# Patient Record
Sex: Female | Born: 2000 | Race: Black or African American | Hispanic: No | Marital: Single | Smoking: Never smoker
Health system: Southern US, Community
[De-identification: ages and names within clinical notes are randomized; demographics above are authoritative.]

## PROBLEM LIST (undated history)

## (undated) DIAGNOSIS — D649 Anemia, unspecified: Secondary | ICD-10-CM

## (undated) DIAGNOSIS — R51 Headache: Secondary | ICD-10-CM

## (undated) DIAGNOSIS — R569 Unspecified convulsions: Secondary | ICD-10-CM

## (undated) DIAGNOSIS — N920 Excessive and frequent menstruation with regular cycle: Secondary | ICD-10-CM

## (undated) HISTORY — DX: Excessive and frequent menstruation with regular cycle: N92.0

## (undated) HISTORY — DX: Headache: R51

## (undated) HISTORY — DX: Anemia, unspecified: D64.9

---
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
## 2022-01-06 ENCOUNTER — Telehealth (INDEPENDENT_AMBULATORY_CARE_PROVIDER_SITE_OTHER): Payer: Self-pay

## 2022-01-06 NOTE — Telephone Encounter (Signed)
Submitted PA on Covermy meds patient only has [facility] Medicaid and pharm is using BCBS. Mom verified this info. She will contact pharm and have them remove the BCBS- RN adv she is submitting the PA- Brand necessary due to having seizures when on generic

## 2022-01-06 NOTE — Telephone Encounter (Signed)
Faxed the PA approval note from Cover My Meds to [facility].

## 2022-03-16 ENCOUNTER — Encounter (INDEPENDENT_AMBULATORY_CARE_PROVIDER_SITE_OTHER): Payer: Self-pay | Admitting: Family

## 2022-03-16 ENCOUNTER — Ambulatory Visit (INDEPENDENT_AMBULATORY_CARE_PROVIDER_SITE_OTHER): Payer: Medicaid Other | Admitting: Family

## 2022-03-16 VITALS — BP 106/60 | HR 74 | Ht 63.94 in | Wt 113.0 lb

## 2022-03-16 DIAGNOSIS — G40309 Generalized idiopathic epilepsy and epileptic syndromes, not intractable, without status epilepticus: Secondary | ICD-10-CM | POA: Diagnosis not present

## 2022-03-16 DIAGNOSIS — G40209 Localization-related (focal) (partial) symptomatic epilepsy and epileptic syndromes with complex partial seizures, not intractable, without status epilepticus: Secondary | ICD-10-CM

## 2022-03-16 DIAGNOSIS — G43009 Migraine without aura, not intractable, without status migrainosus: Secondary | ICD-10-CM | POA: Diagnosis not present

## 2022-03-16 DIAGNOSIS — G479 Sleep disorder, unspecified: Secondary | ICD-10-CM | POA: Diagnosis not present

## 2022-03-16 DIAGNOSIS — R0981 Nasal congestion: Secondary | ICD-10-CM | POA: Diagnosis not present

## 2022-03-16 NOTE — Patient Instructions (Signed)
It was a pleasure to see you today!  Instructions for you until your next appointment are as follows: I have ordered a sleep test called a polysomnogram. You will receive a call to schedule that from the [facility].  I will call you when I receive the sleep test results Continue your medications as prescribed.  Let me know if you have seizures or any other concerns.  Please sign up for MyChart if you have not done so. Please plan to return for follow up in 6 months or sooner if needed.  Feel free to contact our office during normal business hours at [phone] with questions or concerns. If there is no answer or the call is outside business hours, please leave a message and our clinic staff will call you back within the next business day.  If you have an urgent concern, please stay on the line for our after-hours answering service and ask for the on-call neurologist.     I also encourage you to use MyChart to communicate with me more directly. If you have not yet signed up for MyChart within [facility], the front desk staff can help you. However, please note that this inbox is NOT monitored on nights or weekends, and response can take up to 2 business days.  Urgent matters should be discussed with the on-call pediatric neurologist.   At [facility], we are committed to providing exceptional care. You will receive a patient satisfaction survey through text or email regarding your visit today. Your opinion is important to me. Comments are appreciated.

## 2022-03-16 NOTE — Progress Notes (Signed)
Madison [name]   MRN:  [id number]  04-08-2001   Provider: Rockwell Germany NP-C Location of Care: [facility]  Visit type: Return visit   Last visit: 09/08/2021  Referral source: Monna Fam, MD  History from: Epic chart, patient and her mother  Brief history:  Copied from previous record: History of complex partial seizures with secondary generalization, migraine without aura and habitual throat clearing. She is taking and tolerating Tegretol for seizures. She was also taking Topiramate, which improved migraine frequency but stopped it in October 2022. She was evaluated for habitual throat clearing by ENT and was told that it was mild allergy drainage  Today's concerns: Madison [name] and her mother report today that she has had 3 seizures since her last visit. They report that they were brief and did not require intervention. She reports that she is compliant with medication but doesn't sleep well and often awakens feeling as if she is choking and gasping for air. She has ongoing problems with stuffy nose and has been evaluated by ENT for this. Mom has not noted snoring or seizure activity during sleep.   Madison [name] is doing well in school and has been otherwise generally healthy since she was last seen. Neither she nor her mother have other health concerns for her today other than previously mentioned.  Review of systems: Please see HPI for neurologic and other pertinent review of systems. Otherwise all other systems were reviewed and were negative.  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  One seizure occurred during the MRI evaluation.  MRI July 28, 2009 showed increased diffusion and flair signal in the right temporal lobe, right parietal and posterior frontal regions that was cortical and subcortical.  There were also diffusion and ADC changes in the right thalamus.  This was thought to be related to seizures and not a structural or vascular deficit.   One of the episodes was consistent with status epilepticus.  The semiology of seizures was localization-related with secondary generalization with a obvious right brain signature.   EEG July 29, 2009 showed right focal slowing more so than the left.  No seizure activity was seen.  This was consistent with a postictal state.  She was placed on carbamazepine.   In addition to her seizures she had frequent migraine headaches that were severe associated with incapacitating pain and on occasion nausea and vomiting.  In March 2011 propranolol was started to treat her migraines.   ED visit October 04, 2009 with deviation of her eyes the left and rhythmic jerking of her extremities.   ED visit December 28, 2009 with a  seizure that was prolonged treated with rectal diazepam and nasal versed that caused respiratory depression and temporary intubation.  Mother had cut the carbamazepine level because the child was sleepy.   MRI brain August to 2011 showed marked diminution in hyperintensity in the cortical structures and slight increased T2  signal and fullness in the right hippocampal formation.   Telephone note September 22, 2010: 3 seizures in the morning (carbamazepine had been discontinued since December).  This was restarted.   EEG April 08, 2011 showed frequent runs of occipital triphasic spike and slow-wave discharge that was 4 Hz and 165 - 285 V lasting 1-2 seconds without clinical accompaniments more prominent on the right than the left, independent and synchronous.  Levetiracetam was started because carbamazepine made her sleepy but she was unable to tolerate it because of problems with mood, behavior, and bad dreams.   The patient had frequent seizures at school: topiramate was started Nov 12 2011 with significant diminution of seizures.  She had myoclonic jerks at nighttime.  Migraines also improved.   The patient was seen by Dr. Darlis Loan with a history of chest pain, palpitations, and episodes of lightheadedness.  She had nonspecific EKG, a functional heart murmur, and possible right ventricular hypertrophy.   Birth History Infant born at [redacted] weeks gestational age   Gestation was uncomplicated Mother received Epidural anesthesia repeat cesarean section Nursery Course was uncomplicated Growth and Development was recalled as  normal   Surgical history: History reviewed. No pertinent surgical history.   Family history: family history includes Diabetes in her maternal grandmother; Heart Problems in her paternal grandmother; Hypertension in her maternal grandmother; Lung cancer in her maternal grandfather; Migraines in an other family member; Seizures in her brother and another family member.   Social history: Social History   Socioeconomic History   Marital status: Single    Spouse name: Not on file   Number of children: Not on file   Years of education: Not on file   Highest education level: Not on file  Occupational History   Not on file  Tobacco Use   Smoking status: Never    Passive exposure: Never    Smokeless tobacco: Never  Vaping Use   Vaping Use: Never used  Substance and Sexual Activity   Alcohol use: No    Alcohol/week: 0.0 standard drinks of alcohol   Drug use: No   Sexual activity: Never  Other Topics Concern   Not on file  Social History Narrative   Rojean is in 10th grade student.   She will attend [employer]. School will be remote   She lives with her mother and brother.    She enjoys dance, shopping, and playing.   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   Diagnostics/Screenings: Copied from previous record: MRI July 28, 2009 showed increased diffusion and flair signal in the  right temporal lobe, right parietal and posterior frontal regions that was cortical and subcortical.  There were also diffusion and ADC changes in the right thalamus.  This was thought to be related to seizures and not a structural or vascular deficit.   EEG July 29, 2009 showed right focal slowing more so than the left.  No seizure activity was seen.  This was consistent with a postictal state.    MRI brain August to 2011 showed marked diminution in hyperintensity in the cortical structures and slight increased T2 signal and fullness in the right hippocampal formation.   EEG April 08, 2011 showed frequent runs of occipital triphasic spike and slow-wave discharge that was  4 Hz and 165 - 285 V lasting 1-2 seconds without clinical accompaniments more prominent on the right than the left, independent and synchronous.     Physical Exam: BP 106/60   Pulse 74   Ht 5' 3.94" (1.624 m)   Wt 113 lb (51.3 kg)   BMI 19.43 kg/m   General: Well developed, well nourished adolescent girl, seated on exam table, in no evident distress Head: Head normocephalic and atraumatic.  Oropharynx benign. Neck: Supple Cardiovascular: Regular rate and rhythm, no murmurs Respiratory: Breath sounds clear to auscultation Musculoskeletal: No obvious deformities or scoliosis Skin: No rashes or neurocutaneous lesions  Neurologic Exam Mental Status: Awake and fully alert.  Oriented to place and time.  Recent and remote memory intact.  Attention span, concentration, and fund of knowledge appropriate.  Mood and affect appropriate. Cranial Nerves: Fundoscopic exam reveals sharp disc margins.  Pupils equal, briskly reactive to light.  Extraocular movements full without nystagmus. Hearing intact and symmetric to whisper.  Facial sensation intact.  Face tongue, palate move normally and symmetrically. Shoulder shrug normal Motor: Normal bulk and tone. Normal strength in all tested extremity muscles. Sensory: Intact to touch and temperature in all extremities.  Coordination: Rapid alternating movements normal in all extremities.  Finger-to-nose and heel-to shin performed accurately bilaterally.  Romberg negative. Gait and Station: Arises from chair without difficulty.  Stance is normal. Gait demonstrates normal stride length and balance.   Able to heel, toe and tandem walk without difficulty. Reflexes: 1+ and symmetric. Toes downgoing.   Impression: Generalized convulsive epilepsy (HCC) - Plan: Nocturnal polysomnography, Ambulatory referral to Neurology  Sleep disturbance - Plan: Nocturnal polysomnography, Ambulatory referral to Neurology  Migraine without aura and without status migrainosus, not  intractable - Plan: Nocturnal polysomnography, Ambulatory referral to Neurology  Stuffy nose - Plan: Nocturnal polysomnography, Ambulatory referral to Neurology   Recommendations for plan of care: The patient's previous Epic records were reviewed. Kitara has neither had nor required imaging or lab studies since the last visit. She has had 3 brief seizures since her last visit that did not require intervention. She reports problems with sleep and awakening with a choking sensation and gasping for breath. I recommended sleep study and will refer her for that. I asked her to let me know if more seizures occur. I will see her back in follow up in 6 months or sooner if needed.   The medication list was reviewed and reconciled. No changes were made in the prescribed medications today. A complete medication list was provided to the patient.  Orders Placed This Encounter  Procedures   Ambulatory referral to Neurology    Referral Priority:   Routine    Referral Type:   Consultation    Referral Reason:   Specialty Services Required    Requested  Specialty:   Neurology    Number of Visits Requested:   1   Nocturnal polysomnography    Standing Status:   Future    Standing Expiration Date:   03/17/2023    Scheduling Instructions:     21 year old girl with reports of waking at night feeling that she is choking or gasping to breathe. Has history of seizures but has not had witnessed seizure events or post-ictal behaviors with these events.  Perform polysomnogram to evaluate for OSA or other sleep disorder.    Order Specific Question:   Where should this test be performed:    Answer:   [facility]    Return in about 6 months (around 09/14/2022).   Allergies as of 03/16/2022       Reactions   Milk-related Compounds    History of intolerance to milk        Medication List        Accurate as of March 16, 2022 11:59 PM. If you have any questions, ask your nurse or doctor.           cetirizine 10 MG tablet Commonly known as: ZYRTEC Take 1 tablet (10 mg total) by mouth daily.   fluticasone 50 MCG/ACT nasal spray Commonly known as: FLONASE Place 1 spray into both nostrils daily. 1 spray by Nasal route once daily as needed.   ibuprofen 200 MG tablet Commonly known as: ADVIL Take 200 mg by mouth every 6 (six) hours as needed.   loratadine 10 MG tablet Commonly known as: CLARITIN Take 1 tablet (10 mg total) by mouth daily.   TEGretol 200 MG tablet Generic drug: carbamazepine Take 1+1/2 in the morning, 1 at midday and 1+1/2 at bedtime   topiramate 25 MG tablet Commonly known as: TOPAMAX Take 1 tablet at bedtime   Valtoco 10 MG Dose 10 MG/0.1ML Liqd Generic drug: diazePAM Place 10 mg into the nose as needed (For seizures 2 minutes or longer).      Total time spent with the patient was 25 minutes, of which 50% or more was spent in counseling and coordination of care.  Elveria Rising NP-C [facility] Ph. [phone] Fax [phone]

## 2022-03-20 ENCOUNTER — Encounter (INDEPENDENT_AMBULATORY_CARE_PROVIDER_SITE_OTHER): Payer: Self-pay | Admitting: Family

## 2022-03-20 MED ORDER — TOPIRAMATE 25 MG PO TABS: ORAL_TABLET | ORAL | 5 refills | Status: DC

## 2022-03-20 MED ORDER — TEGRETOL 200 MG PO TABS: ORAL_TABLET | ORAL | 5 refills | Status: DC

## 2022-04-14 ENCOUNTER — Encounter: Payer: Self-pay | Admitting: Neurology

## 2022-04-14 ENCOUNTER — Institutional Professional Consult (permissible substitution): Payer: Medicaid Other | Admitting: Neurology

## 2022-09-16 ENCOUNTER — Telehealth (INDEPENDENT_AMBULATORY_CARE_PROVIDER_SITE_OTHER): Payer: Self-pay | Admitting: Family

## 2022-09-16 ENCOUNTER — Ambulatory Visit (INDEPENDENT_AMBULATORY_CARE_PROVIDER_SITE_OTHER): Payer: Medicaid Other | Admitting: Family

## 2022-09-16 DIAGNOSIS — G40209 Localization-related (focal) (partial) symptomatic epilepsy and epileptic syndromes with complex partial seizures, not intractable, without status epilepticus: Secondary | ICD-10-CM

## 2022-09-16 DIAGNOSIS — G40309 Generalized idiopathic epilepsy and epileptic syndromes, not intractable, without status epilepticus: Secondary | ICD-10-CM

## 2022-09-16 MED ORDER — TEGRETOL 200 MG PO TABS: ORAL_TABLET | ORAL | 0 refills | Status: DC

## 2022-09-16 NOTE — Telephone Encounter (Signed)
  Name of who is calling: Shameka  Caller's Relationship to Patient: Mom  Best contact number: [phone]  Provider they see: Rockwell Germany   Reason for call: Mom is calling to get refill on prescription.       PRESCRIPTION REFILL ONLY  Name of prescription: Tegretol  Pharmacy: [facility] [place].

## 2022-09-16 NOTE — Telephone Encounter (Signed)
Rx printed and faxed to the pharmacy. TG

## 2022-10-15 ENCOUNTER — Ambulatory Visit (INDEPENDENT_AMBULATORY_CARE_PROVIDER_SITE_OTHER): Payer: Medicaid Other | Admitting: Family

## 2022-10-16 ENCOUNTER — Encounter (INDEPENDENT_AMBULATORY_CARE_PROVIDER_SITE_OTHER): Payer: Self-pay

## 2022-10-19 ENCOUNTER — Telehealth (INDEPENDENT_AMBULATORY_CARE_PROVIDER_SITE_OTHER): Payer: Self-pay | Admitting: Family

## 2022-10-19 NOTE — Telephone Encounter (Signed)
Attempted to contact patients parent to notify her of this.  Unable to be reached.  LVM to call back.  SS, CCMA

## 2022-10-19 NOTE — Telephone Encounter (Signed)
  Name of who is calling: Sleep referrals coord  Caller's Relationship to Patient:  Best contact number: [phone]  Provider they see: Annette Stable  Reason for call: Received fax referral for patient, she was scheduled to be seen back in October and was a no show. If she is still needing to be seen, have parent call back and inform them they will have to pay a no show fee and once that is paid they can be reschedule. Referral is still active     PRESCRIPTION REFILL ONLY  Name of prescription:  Pharmacy:

## 2022-10-21 ENCOUNTER — Encounter (INDEPENDENT_AMBULATORY_CARE_PROVIDER_SITE_OTHER): Payer: Self-pay | Admitting: Family

## 2022-10-21 ENCOUNTER — Ambulatory Visit (INDEPENDENT_AMBULATORY_CARE_PROVIDER_SITE_OTHER): Payer: Medicaid Other | Admitting: Family

## 2022-10-21 VITALS — BP 114/60 | HR 82 | Ht 63.74 in | Wt 110.9 lb

## 2022-10-21 DIAGNOSIS — G40209 Localization-related (focal) (partial) symptomatic epilepsy and epileptic syndromes with complex partial seizures, not intractable, without status epilepticus: Secondary | ICD-10-CM | POA: Diagnosis not present

## 2022-10-21 DIAGNOSIS — G40309 Generalized idiopathic epilepsy and epileptic syndromes, not intractable, without status epilepticus: Secondary | ICD-10-CM

## 2022-10-21 DIAGNOSIS — G43009 Migraine without aura, not intractable, without status migrainosus: Secondary | ICD-10-CM

## 2022-10-21 MED ORDER — TOPIRAMATE 25 MG PO TABS: ORAL_TABLET | ORAL | 5 refills | Status: DC

## 2022-10-21 MED ORDER — TEGRETOL 200 MG PO TABS: ORAL_TABLET | ORAL | 1 refills | Status: AC

## 2022-10-21 MED ORDER — VALTOCO 10 MG DOSE 10 MG/0.1ML NA LIQD: 10.0000 mg | NASAL | 3 refills | Status: DC | PRN

## 2022-10-21 NOTE — Progress Notes (Unsigned)
Madison [name]   MRN:  [id number]  2000/08/14   Provider: Elveria Rising NP-C Location of Care: [facility]  Visit type: Return visit  Last visit: 03/16/2022  Referral source: Aggie Hacker, MD History from: Epic chart and patient  Brief history:  Copied from previous record: History of complex partial seizures with secondary generalization, migraine without aura and habitual throat clearing. She is taking and tolerating Tegretol for seizures. She was also taking Topiramate, which improved migraine frequency but stopped it in October 2022. She was evaluated for habitual throat clearing by ENT and was told that it was mild allergy drainage   Today's concerns: Had a brief seizure 1 month ago. Unsure of what triggered it. Denies missed medication and says that she generally gets at least 8 hours of sleep each night Enrolled in college and says that overall things are going well but that she struggles with math Reports occasional migraine and tension headaches. Has not missed school due to headache. Madison [name] has been otherwise generally healthy since she was last seen. No health concerns today other than previously mentioned.  Review of systems: Please see HPI for neurologic and other pertinent review of systems. Otherwise all other systems were reviewed and were negative.  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  One seizure occurred during the MRI evaluation.  MRI July 28, 2009 showed increased diffusion and flair signal in the right temporal lobe, right parietal and posterior frontal regions that was cortical and subcortical.  There were also diffusion and ADC changes in the right thalamus.  This was thought to be related to seizures and not a structural or vascular deficit.   One of the episodes was consistent with status epilepticus.  The semiology of seizures was localization-related with secondary generalization with a obvious right brain signature.   EEG July 29, 2009 showed right focal slowing more so than the left.  No seizure activity was seen.  This was consistent with a postictal state.  She was placed on carbamazepine.   In addition to her seizures she had frequent migraine headaches that were severe associated with incapacitating pain and on occasion nausea and vomiting.  In March 2011 propranolol was started to treat her migraines.   ED visit October 04, 2009 with deviation of her eyes the left and rhythmic jerking of her extremities.   ED visit December 28, 2009 with a seizure that was prolonged treated with rectal diazepam and nasal versed that caused respiratory depression and temporary intubation.  Mother had cut the carbamazepine level because the child was sleepy.   MRI  brain August to 2011 showed marked diminution in hyperintensity in the cortical structures and slight increased T2 signal and fullness in the right hippocampal formation.   Telephone note September 22, 2010: 3 seizures in the morning (carbamazepine had been discontinued  since December).  This was restarted.   EEG April 08, 2011 showed frequent runs of occipital triphasic spike and slow-wave discharge that was 4 Hz and 165 - 285 V lasting 1-2 seconds without clinical accompaniments more prominent on the right than the left, independent and synchronous.  Levetiracetam was started because carbamazepine made her sleepy but she was unable to tolerate it because of problems with mood, behavior, and bad dreams.   The patient had frequent seizures at school: topiramate was started Nov 12 2011 with significant diminution of seizures.  She had myoclonic jerks at nighttime.  Migraines also improved.   The patient was seen by Dr. Darlis Loan with a history of chest pain, palpitations, and episodes of lightheadedness.  She had nonspecific EKG, a functional heart murmur, and possible right ventricular hypertrophy.   Birth History Infant born at [redacted] weeks gestational age   Gestation was uncomplicated Mother received Epidural anesthesia repeat cesarean section Nursery Course was uncomplicated Growth and Development was recalled as  normal   Surgical history: History reviewed. No pertinent surgical history.   Family history: family history includes Diabetes in her maternal grandmother; Heart Problems in her paternal grandmother; Hypertension in her maternal grandmother; Lung cancer in her maternal grandfather; Migraines in an other family member; Seizures in her brother and another family member.   Social history: Social History   Socioeconomic History   Marital status: Single    Spouse name: Not on file   Number of children: Not on file   Years of education: Not on file   Highest education level: Not on file  Occupational History   Not on file  Tobacco Use   Smoking status: Never    Passive exposure: Never   Smokeless tobacco: Never  Vaping Use   Vaping Use: Never used  Substance and Sexual Activity   Alcohol use: No    Alcohol/week: 0.0 standard drinks of  alcohol   Drug use: No   Sexual activity: Never  Other Topics Concern   Not on file  Social History Narrative   Leontine attended [employer].   Currently Attending [employer].    She lives with her mother and brother.    She enjoys dance, shopping, and playing.   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   Diagnostics/Screenings: Copied from previous record: MRI July 28, 2009 showed increased diffusion and flair signal in the right temporal lobe, right parietal and posterior frontal regions that was cortical and subcortical.  There were also diffusion and ADC changes in the right thalamus.  This was thought to be related to seizures and not a structural  or vascular deficit.   EEG July 29, 2009 showed right focal slowing more so than the left.  No seizure activity was seen.  This was consistent with a postictal state.    MRI brain August to 2011 showed marked diminution in hyperintensity in the cortical structures and slight increased T2 signal and fullness in the right hippocampal formation.   EEG April 08, 2011 showed frequent runs of occipital triphasic spike and slow-wave discharge that was 4 Hz and 165 - 285 V lasting 1-2 seconds without clinical accompaniments more prominent on the right than the left, independent and synchronous.     Physical  Exam: BP 114/60 (BP Location: Left Arm, Patient Position: Sitting, Cuff Size: Small)   Pulse 82   Ht 5' 3.74" (1.619 m)   Wt 110 lb 14.4 oz (50.3 kg)   LMP 10/05/2022 (Exact Date)   BMI 19.19 kg/m   General: Well developed, well nourished young woman, seated on exam table, in no evident distress Head: Head normocephalic and atraumatic.  Oropharynx benign. Neck: Supple Cardiovascular: Regular rate and rhythm, no murmurs Respiratory: Breath sounds clear to auscultation Musculoskeletal: No obvious deformities or scoliosis Skin: No rashes or neurocutaneous lesions  Neurologic Exam Mental Status: Awake and fully alert.  Oriented to place and time.  Recent and remote memory intact.  Attention span, concentration, and fund of knowledge appropriate.  Mood and affect appropriate. Cranial Nerves: Fundoscopic exam reveals sharp disc margins.  Pupils equal, briskly reactive to light.  Extraocular movements full without nystagmus. Hearing intact and symmetric to whisper.  Facial sensation intact.  Face tongue, palate move normally and symmetrically. Shoulder shrug normal Motor: Normal bulk and tone. Normal strength in all tested extremity muscles. Sensory: Intact to touch and temperature in all extremities.  Coordination: Rapid alternating movements normal in all extremities.  Finger-to-nose and heel-to shin performed accurately bilaterally.  Romberg negative. Gait and Station: Arises from chair without difficulty.  Stance is normal. Gait demonstrates normal stride length and balance.   Able to heel, toe and tandem walk without difficulty. Reflexes: 1+ and symmetric. Toes downgoing.   Impression: Generalized convulsive epilepsy (HCC) - Plan: EEG Child, TEGRETOL 200 MG tablet, VALTOCO 10 MG DOSE 10 MG/0.1ML LIQD  Partial epilepsy with impairment of consciousness (HCC) - Plan: EEG Child, TEGRETOL 200 MG tablet, VALTOCO 10 MG DOSE 10 MG/0.1ML LIQD  Migraine without aura and without status migrainosus,  not intractable - Plan: topiramate (TOPAMAX) 25 MG tablet   Recommendations for plan of care: The patient's previous Epic records were reviewed. No recent diagnostic studies to be reviewed with the patient. I talked with Madison [name] about the Tegretol and my concerns for her taking the medication as a young woman of childbearing age. She agreed to the plan of performing an EEG and then switching to a newer antiepileptic medication that is safer for the fetus if she should be come pregnant.  Plan until next visit: Continue medications as prescribed for now EEG ordered.  I will see Madison [name] after the EEG to review results and make a treatment plan.   The medication list was reviewed and reconciled. No changes were made in the prescribed medications today. A complete medication list was provided to the patient.  Orders Placed This Encounter  Procedures   EEG Child    Standing Status:   Future    Standing Expiration Date:   10/21/2023    Order Specific Question:   Where should this test be performed?    Answer:  [facility]    Order Specific Question:   Reason for exam    Answer:   Other (see comment)    Order Specific Question:   Comment    Answer:   Breakthrough seizures, on Tegretol     Allergies as of 10/21/2022       Reactions   Milk-related Compounds    History of intolerance to milk        Medication List        Accurate as of Oct 21, 2022 11:59 PM. If you have any questions, ask your nurse or doctor.          cetirizine 10 MG tablet Commonly known as: ZYRTEC Take 1 tablet (10 mg total) by mouth daily.   chlorhexidine 0.12 % solution Commonly known as: PERIDEX SMARTSIG:By Mouth   fluticasone 50 MCG/ACT nasal spray Commonly known as: FLONASE Place 1 spray into both nostrils daily. 1 spray by Nasal route once daily as needed.   ibuprofen 200 MG tablet Commonly known as: ADVIL Take 200 mg by mouth every 6 (six) hours as needed.   loratadine 10 MG  tablet Commonly known as: CLARITIN Take 1 tablet (10 mg total) by mouth daily.   TEGretol 200 MG tablet Generic drug: carbamazepine Take 1+1/2 in the morning, 1 at midday and 1+1/2 at bedtime   topiramate 25 MG tablet Commonly known as: TOPAMAX Take 1 tablet at bedtime   Valtoco 10 MG Dose 10 MG/0.1ML Liqd Generic drug: diazePAM Place 10 mg into the nose as needed (For seizures 2 minutes or longer).      Total time spent with the patient was 25 minutes, of which 50% or more was spent in counseling and coordination of care.  Elveria Rising NP-C [facility] [address], [address] [place], [place] [address] Ph. [phone] Fax [phone]

## 2022-10-21 NOTE — Patient Instructions (Signed)
It was a pleasure to see you today!  Instructions for you until your next appointment are as follows: We will schedule an EEG to be done at this office. I will call you when I receive the results, usually about a day or so after the test has been done, and we will schedule a follow up appointment at that time to make a plan for your seizure medicine.  We are doing this to help decide which seizure medicine to change you to, as we discussed today. I want to switch you to a medication that is safer in pregnancy as you are a young woman that may consider becoming pregnant one day.  Continue to take all your medications as prescribed for now. Do not stop or change any medications until instructed to do so.  Please sign up for MyChart if you have not done so. Please plan to return for follow up in or sooner if needed.   Feel free to contact our office during normal business hours at [phone] with questions or concerns. If there is no answer or the call is outside business hours, please leave a message and our clinic staff will call you back within the next business day.  If you have an urgent concern, please stay on the line for our after-hours answering service and ask for the on-call neurologist.     I also encourage you to use MyChart to communicate with me more directly. If you have not yet signed up for MyChart within [facility], the front desk staff can help you. However, please note that this inbox is NOT monitored on nights or weekends, and response can take up to 2 business days.  Urgent matters should be discussed with the on-call pediatric neurologist.   At [facility], we are committed to providing exceptional care. You will receive a patient satisfaction survey through text or email regarding your visit today. Your opinion is important to me. Comments are appreciated.

## 2022-10-22 ENCOUNTER — Other Ambulatory Visit: Payer: Self-pay

## 2022-10-22 ENCOUNTER — Emergency Department (HOSPITAL_COMMUNITY): Payer: Medicaid Other

## 2022-10-22 ENCOUNTER — Emergency Department (HOSPITAL_COMMUNITY)
Admission: EM | Admit: 2022-10-22 | Discharge: 2022-10-22 | Discharge status: Against Medical Advice | Payer: Medicaid Other | Attending: Emergency Medicine | Admitting: Emergency Medicine

## 2022-10-22 ENCOUNTER — Encounter (INDEPENDENT_AMBULATORY_CARE_PROVIDER_SITE_OTHER): Payer: Self-pay | Admitting: Family

## 2022-10-22 ENCOUNTER — Encounter (HOSPITAL_COMMUNITY): Payer: Self-pay

## 2022-10-22 DIAGNOSIS — Z5321 Procedure and treatment not carried out due to patient leaving prior to being seen by health care provider: Secondary | ICD-10-CM | POA: Insufficient documentation

## 2022-10-22 DIAGNOSIS — J029 Acute pharyngitis, unspecified: Secondary | ICD-10-CM | POA: Insufficient documentation

## 2022-10-22 DIAGNOSIS — Z1152 Encounter for screening for COVID-19: Secondary | ICD-10-CM | POA: Insufficient documentation

## 2022-10-22 DIAGNOSIS — R059 Cough, unspecified: Secondary | ICD-10-CM | POA: Insufficient documentation

## 2022-10-22 LAB — GROUP A STREP BY PCR: Group A Strep by PCR: NOT DETECTED

## 2022-10-22 LAB — SARS CORONAVIRUS 2 BY RT PCR: SARS Coronavirus 2 by RT PCR: NEGATIVE

## 2022-10-22 NOTE — ED Provider Triage Note (Signed)
Emergency Medicine Provider Triage Evaluation Note  Madison [name] , a 22 y.o. female  was evaluated in triage.  Pt complains of sore throat.  Patient reports that she was sick about a week or so ago with a sore throat and a cough which seem to somewhat improved.  She is now reporting that sore throat is still present she is having some intermittent chest pain when she attempts to cough.  Feels that she may have some congestion in her chest.  Denies any fevers.  Only significant past medical history is that of epilepsy and patient has not been actively taking her prescribed treatment.  No prior cardiac history.  Review of Systems  Positive: As above Negative: As above  Physical Exam  BP 112/63 (BP Location: Right Arm)   Pulse 85   Temp 98.8 F (37.1 C) (Oral)   Resp 14   Ht 5' 3.74" (1.619 m)   Wt 49.9 kg   LMP 10/05/2022 (Exact Date)   SpO2 100%   BMI 19.04 kg/m  Gen:   Awake, no distress  Resp:  Normal effort, clear to auscultation bilaterally MSK:   Moves extremities without difficulty  Other:  Minimal erythema in throat with tonsils at 1+ bilaterally  Medical Decision Making  Medically screening exam initiated at 1:15 PM.  Appropriate orders placed.  Madison [name] was informed that the remainder of the evaluation will be completed by another provider, this initial triage assessment does not replace that evaluation, and the importance of remaining in the ED until their evaluation is complete.     Smitty Knudsen, PA-C 10/22/22 1317

## 2022-10-22 NOTE — ED Triage Notes (Signed)
Patient complains of sore throat that started yesterday but had a cold prior.  Patient reports pain in the center of her chest and when she coughs it goes up into her throat. Pain is not reproducible.

## 2022-11-13 ENCOUNTER — Other Ambulatory Visit (INDEPENDENT_AMBULATORY_CARE_PROVIDER_SITE_OTHER): Payer: Medicaid Other

## 2022-12-03 ENCOUNTER — Encounter: Payer: BC Managed Care – PPO | Admitting: Obstetrics and Gynecology

## 2023-05-15 ENCOUNTER — Emergency Department (HOSPITAL_COMMUNITY)
Admission: EM | Admit: 2023-05-15 | Discharge: 2023-05-15 | Disposition: A | Discharge status: Home / Self Care | Payer: Medicaid Other | Attending: Emergency Medicine | Admitting: Emergency Medicine

## 2023-05-15 ENCOUNTER — Other Ambulatory Visit: Payer: Self-pay

## 2023-05-15 ENCOUNTER — Encounter (HOSPITAL_COMMUNITY): Payer: Self-pay | Admitting: *Deleted

## 2023-05-15 ENCOUNTER — Emergency Department (HOSPITAL_COMMUNITY): Payer: Medicaid Other

## 2023-05-15 DIAGNOSIS — R002 Palpitations: Secondary | ICD-10-CM | POA: Diagnosis present

## 2023-05-15 LAB — BASIC METABOLIC PANEL
Anion gap: 6 (ref 5–15)
BUN: 11 mg/dL (ref 6–20)
CO2: 22 mmol/L (ref 22–32)
Calcium: 9.7 mg/dL (ref 8.9–10.3)
Chloride: 108 mmol/L (ref 98–111)
Creatinine, Ser: 0.78 mg/dL (ref 0.44–1.00)
GFR, Estimated: >60 mL/min (ref >60)
Glucose, Bld: 102 mg/dL — ABNORMAL HIGH (ref 70–99)
Potassium: 3.7 mmol/L (ref 3.5–5.1)
Sodium: 136 mmol/L (ref 135–145)

## 2023-05-15 LAB — CBC
HCT: 28.1 % — ABNORMAL LOW (ref 36.0–46.0)
Hemoglobin: 8.1 g/dL — ABNORMAL LOW (ref 12.0–15.0)
MCH: 20.1 pg — ABNORMAL LOW (ref 26.0–34.0)
MCHC: 28.8 g/dL — ABNORMAL LOW (ref 30.0–36.0)
MCV: 69.9 fL — ABNORMAL LOW (ref 80.0–100.0)
Platelets: 357 10*3/uL (ref 150–400)
RBC: 4.02 MIL/uL (ref 3.87–5.11)
RDW: 19.1 % — ABNORMAL HIGH (ref 11.5–15.5)
WBC: 5.4 10*3/uL (ref 4.0–10.5)
nRBC: 0 % (ref 0.0–0.2)

## 2023-05-15 LAB — TSH: TSH: 5.918 u[IU]/mL — ABNORMAL HIGH (ref 0.350–4.500)

## 2023-05-15 LAB — MAGNESIUM: Magnesium: 1.9 mg/dL (ref 1.7–2.4)

## 2023-05-15 LAB — HCG, SERUM, QUALITATIVE: Preg, Serum: NEGATIVE

## 2023-05-15 NOTE — ED Provider Notes (Signed)
[place] EMERGENCY DEPARTMENT AT [facility] Provider Note   CSN: [id number] Arrival date & time: 05/15/23  2056     History  Chief Complaint  Patient presents with   Irregular Heart Beat    Madison [name] is a 22 y.o. female.  22 year old female with prior medical history as detailed below presents for evaluation.  Patient reports intermittent palpitations x 1 week.  She reports episodes that last approximately 5 minutes at a time.  She reports feeling her heart race.  This happens multiple times per day.  She denies any symptoms on official evaluation tonight.  She denies associated chest pain, shortness of breath, nausea, vomiting, other complaint.  She is without known cardiac history.  She does have a history of seizure.  She is compliant with previously prescribed Tegretol and Topamax.  She denies recent seizure activity.  She is accompanied by her mother who is at bedside.  The history is provided by the patient and medical records.       Home Medications Prior to Admission medications   Medication Sig Start Date End Date Taking? Authorizing Provider  cetirizine (ZYRTEC) 10 MG tablet Take 1 tablet (10 mg total) by mouth daily. 03/08/17   Ronnell Freshwater, NP  chlorhexidine (PERIDEX) 0.12 % solution SMARTSIG:By Mouth 07/14/22   [provider]  fluticasone (FLONASE) 50 MCG/ACT nasal spray Place 1 spray into both nostrils daily. 1 spray by Nasal route once daily as needed. 03/08/17   Ronnell Freshwater, NP  ibuprofen (ADVIL,MOTRIN) 200 MG tablet Take 200 mg by mouth every 6 (six) hours as needed.    [provider]  loratadine (CLARITIN) 10 MG tablet Take 1 tablet (10 mg total) by mouth daily. 09/16/13   Marcellina Millin, MD  TEGRETOL 200 MG tablet Take 1+1/2 in the morning, 1 at midday and 1+1/2 at bedtime 10/21/22   Elveria Rising, NP  topiramate (TOPAMAX) 25 MG tablet Take 1 tablet at bedtime 10/21/22   Elveria Rising, NP   VALTOCO 10 MG DOSE 10 MG/0.1ML LIQD Place 10 mg into the nose as needed (For seizures 2 minutes or longer). 10/21/22   Elveria Rising, NP      Allergies    Milk-related compounds    Review of Systems   Review of Systems  All other systems reviewed and are negative.   Physical Exam Updated Vital Signs BP 102/64 (BP Location: Left Arm)   Pulse 98   Temp 98.9 F (37.2 C) (Oral)   Resp 16   Ht 5\' 3"  (1.6 m)   Wt 49.9 kg   SpO2 99%   BMI 19.49 kg/m  Physical Exam Vitals and nursing note reviewed.  Constitutional:      General: She is not in acute distress.    Appearance: Normal appearance. She is well-developed.  HENT:     Head: Normocephalic and atraumatic.  Eyes:     Conjunctiva/sclera: Conjunctivae normal.     Pupils: Pupils are equal, round, and reactive to light.  Cardiovascular:     Rate and Rhythm: Normal rate and regular rhythm.     Heart sounds: Normal heart sounds.  Pulmonary:     Effort: Pulmonary effort is normal. No respiratory distress.     Breath sounds: Normal breath sounds.  Abdominal:     General: There is no distension.     Palpations: Abdomen is soft.     Tenderness: There is no abdominal tenderness.  Musculoskeletal:        General:  No deformity. Normal range of motion.     Cervical back: Normal range of motion and neck supple.  Skin:    General: Skin is warm and dry.  Neurological:     General: No focal deficit present.     Mental Status: She is alert and oriented to person, place, and time.     ED Results / Procedures / Treatments   Labs (all labs ordered are listed, but only abnormal results are displayed) Labs Reviewed  CBC  URINALYSIS, ROUTINE W REFLEX MICROSCOPIC  HCG, SERUM, QUALITATIVE  BASIC METABOLIC PANEL  TSH  MAGNESIUM  RAPID URINE DRUG SCREEN, HOSP PERFORMED    EKG None  Radiology No results found.  Procedures Procedures    Medications Ordered in ED Medications - No data to display  ED Course/ Medical  Decision Making/ A&P                                 Medical Decision Making Amount and/or Complexity of Data Reviewed Labs: ordered. Radiology: ordered.    Medical Screen Complete  This patient presented to the ED with complaint of palpitations.  This complaint involves an extensive number of treatment options. The initial differential diagnosis includes, but is not limited to, arrhythmia, metabolic abnormality, etc.  This presentation is: Acute, Self-Limited, Previously Undiagnosed, Uncertain Prognosis, Complicated, Systemic Symptoms, and Threat to Life/Bodily Function  Patient presents with complaint of intermittent palpitations x 1 week.  Patient is without complaint at time of evaluation.    Screening labs are without significant abnormality.  Notably, TSH is moderately elevated.  Patient is on Topamax which is known to cause elevation of TSH.  Patient's symptoms are not suggestive of hypothyroidism.  Patient's mother is educated to about this and close follow-up with PCP is recommended.  Outpatient cardiology referral made.  Patient is appropriate for discharge home.  Importance of close follow-up is stressed.    Additional history obtained:  Additional history obtained from [facility] External records from outside sources obtained and reviewed including prior ED visits and prior Inpatient records.    Lab Tests:  I ordered and personally interpreted labs.  The pertinent results include: CBC, BMP, TSH, hCG, magnesium   Imaging Studies ordered:  I ordered imaging studies including chest x-ray I independently visualized and interpreted obtained imaging which showed NAD I agree with the radiologist interpretation.   Cardiac Monitoring:  The patient was maintained on a cardiac monitor.  I personally viewed and interpreted the cardiac monitor which showed an underlying rhythm of: NSR  Problem List / ED Course:  Palpitations   Reevaluation:  After the  interventions noted above, I reevaluated the patient and found that they have: improved  Disposition:  After consideration of the diagnostic results and the patients response to treatment, I feel that the patent would benefit from close outpatient follow-up.          Final Clinical Impression(s) / ED Diagnoses Final diagnoses:  Palpitations    Rx / DC Orders ED Discharge Orders     None         Wynetta Fines, MD 05/15/23 2246

## 2023-05-15 NOTE — Discharge Instructions (Signed)
Return for any problem.  ?

## 2023-05-15 NOTE — ED Triage Notes (Signed)
The pt has been having a rapid irregular heart rate for one week and she feels like she cannot get her  breath she denies anxiety or panic attacks lmp  2 weeks ago

## 2023-06-25 ENCOUNTER — Encounter: Payer: Self-pay | Admitting: Nurse Practitioner

## 2023-06-25 ENCOUNTER — Ambulatory Visit: Payer: Medicaid Other | Admitting: Nurse Practitioner

## 2023-06-25 VITALS — BP 108/64 | HR 104 | Resp 16 | Ht 63.0 in | Wt 107.4 lb

## 2023-06-25 DIAGNOSIS — Z23 Encounter for immunization: Secondary | ICD-10-CM

## 2023-06-25 DIAGNOSIS — Z114 Encounter for screening for human immunodeficiency virus [HIV]: Secondary | ICD-10-CM

## 2023-06-25 DIAGNOSIS — G40309 Generalized idiopathic epilepsy and epileptic syndromes, not intractable, without status epilepticus: Secondary | ICD-10-CM

## 2023-06-25 DIAGNOSIS — G43009 Migraine without aura, not intractable, without status migrainosus: Secondary | ICD-10-CM | POA: Diagnosis not present

## 2023-06-25 DIAGNOSIS — Z131 Encounter for screening for diabetes mellitus: Secondary | ICD-10-CM

## 2023-06-25 DIAGNOSIS — Z1159 Encounter for screening for other viral diseases: Secondary | ICD-10-CM

## 2023-06-25 DIAGNOSIS — Z13220 Encounter for screening for lipoid disorders: Secondary | ICD-10-CM

## 2023-06-25 DIAGNOSIS — D649 Anemia, unspecified: Secondary | ICD-10-CM | POA: Diagnosis not present

## 2023-06-25 DIAGNOSIS — R7989 Other specified abnormal findings of blood chemistry: Secondary | ICD-10-CM | POA: Diagnosis not present

## 2023-06-25 DIAGNOSIS — Z7689 Persons encountering health services in other specified circumstances: Secondary | ICD-10-CM

## 2023-06-25 NOTE — Progress Notes (Signed)
 BP 108/64   Pulse (!) 104   Resp 16   Ht 5' 3 (1.6 m)   Wt 107 lb 6.4 oz (48.7 kg)   LMP 06/19/2023 (Exact Date)   SpO2 100%   BMI 19.03 kg/m    Subjective:    Patient ID: Madison [name], female    DOB: [birth year], 22 y.o.   MRN: [id number]  HPI: Madison [name] is a 23 y.o. female  Chief Complaint  Patient presents with   Establish Care    Discussed the use of AI scribe software for clinical note transcription with the patient, who gave verbal consent to proceed.  History of Present Illness   The patient, with a history of epilepsy and migraines, presents to establish care. She was recently seen in the ER for palpitations. During that visit, lab work revealed elevated TSH and anemia. The patient denies symptoms of hypothyroidism. She has a history of heavy periods, which are suspected to contribute to the anemia. The patient's epilepsy is managed with Tegretol , and she continues to follow with a pediatric neurologist. The last seizure episode was a few months ago, and the patient reports that the seizures have become less severe since starting medication. Migraines are infrequent and managed with ibuprofen . The patient also has allergic sinusitis, managed with daily Zyrtec  and Flonase .       06/25/2023   11:08 AM  Depression screen PHQ 2/9  Decreased Interest 0  Down, Depressed, Hopeless 0  PHQ - 2 Score 0  Altered sleeping 0  Tired, decreased energy 0  Change in appetite 0  Feeling bad or failure about yourself  0  Trouble concentrating 0  Moving slowly or fidgety/restless 0  Suicidal thoughts 0  PHQ-9 Score 0  Difficult doing work/chores Not difficult at all    Relevant past medical, surgical, family and social history reviewed and updated as indicated. Interim medical history since our last visit reviewed. Allergies and medications reviewed and updated.  Review of Systems  Constitutional: Negative for fever or weight change.  Respiratory: Negative for cough and  shortness of breath.   Cardiovascular: Negative for chest pain or palpitations.  Gastrointestinal: Negative for abdominal pain, no bowel changes.  Musculoskeletal: Negative for gait problem or joint swelling.  Skin: Negative for rash.  Neurological: Negative for dizziness or headache.  No other specific complaints in a complete review of systems (except as listed in HPI above).      Objective:    BP 108/64   Pulse (!) 104   Resp 16   Ht 5' 3 (1.6 m)   Wt 107 lb 6.4 oz (48.7 kg)   LMP 06/19/2023 (Exact Date)   SpO2 100%   BMI 19.03 kg/m    Wt Readings from Last 3 Encounters:  06/25/23 107 lb 6.4 oz (48.7 kg)  05/15/23 110 lb 0.2 oz (49.9 kg) (15%, Z= -1.04)*  10/22/22 110 lb (49.9 kg) (16%, Z= -1.00)*   * Growth percentiles are based on CDC (Girls, 2-20 Years) data.    Physical Exam  Constitutional: Patient appears well-developed and well-nourished.  No distress.  HEENT: head atraumatic, normocephalic, pupils equal and reactive to light, neck supple Cardiovascular: Normal rate, regular rhythm and normal heart sounds.  No murmur heard. No BLE edema. Pulmonary/Chest: Effort normal and breath sounds normal. No respiratory distress. Abdominal: Soft.  There is no tenderness. Psychiatric: Patient has a normal mood and affect. behavior is normal. Judgment and thought content normal.  Results for orders placed or performed  during the hospital encounter of 05/15/23  CBC   Collection Time: 05/15/23  9:33 PM  Result Value Ref Range   WBC 5.4 4.0 - 10.5 K/uL   RBC 4.02 3.87 - 5.11 MIL/uL   Hemoglobin 8.1 (L) 12.0 - 15.0 g/dL   HCT 71.8 (L) 63.9 - 53.9 %   MCV 69.9 (L) 80.0 - 100.0 fL   MCH 20.1 (L) 26.0 - 34.0 pg   MCHC 28.8 (L) 30.0 - 36.0 g/dL   RDW 80.8 (H) 88.4 - 84.4 %   Platelets 357 150 - 400 K/uL   nRBC 0.0 0.0 - 0.2 %  hCG, serum, qualitative   Collection Time: 05/15/23  9:33 PM  Result Value Ref Range   Preg, Serum NEGATIVE NEGATIVE  Basic metabolic panel    Collection Time: 05/15/23  9:33 PM  Result Value Ref Range   Sodium 136 135 - 145 mmol/L   Potassium 3.7 3.5 - 5.1 mmol/L   Chloride 108 98 - 111 mmol/L   CO2 22 22 - 32 mmol/L   Glucose, Bld 102 (H) 70 - 99 mg/dL   BUN 11 6 - 20 mg/dL   Creatinine, Ser 9.21 0.44 - 1.00 mg/dL   Calcium 9.7 8.9 - 89.6 mg/dL   GFR, Estimated >39 >39 mL/min   Anion gap 6 5 - 15  TSH   Collection Time: 05/15/23  9:33 PM  Result Value Ref Range   TSH 5.918 (H) 0.350 - 4.500 uIU/mL  Magnesium   Collection Time: 05/15/23  9:33 PM  Result Value Ref Range   Magnesium 1.9 1.7 - 2.4 mg/dL   Last CBC Lab Results  Component Value Date   WBC 5.4 05/15/2023   HGB 8.1 (L) 05/15/2023   HCT 28.1 (L) 05/15/2023   MCV 69.9 (L) 05/15/2023   MCH 20.1 (L) 05/15/2023   RDW 19.1 (H) 05/15/2023   PLT 357 05/15/2023   Last metabolic panel Lab Results  Component Value Date   GLUCOSE 102 (H) 05/15/2023   NA 136 05/15/2023   K 3.7 05/15/2023   CL 108 05/15/2023   CO2 22 05/15/2023   BUN 11 05/15/2023   CREATININE 0.78 05/15/2023   GFRNONAA >60 05/15/2023   CALCIUM 9.7 05/15/2023   PROT 7.2 08/30/2013   ALBUMIN 4.3 08/30/2013   BILITOT <0.2 (L) 08/30/2013   ALKPHOS 316 08/30/2013   AST 27 08/30/2013   ALT 19 08/30/2013   ANIONGAP 6 05/15/2023   Last thyroid  functions Lab Results  Component Value Date   TSH 5.918 (H) 05/15/2023        Assessment & Plan:   Problem List Items Addressed This Visit       Cardiovascular and Mediastinum   Migraine without aura and without status migrainosus, not intractable - Primary     Nervous and Auditory   Generalized convulsive epilepsy (HCC) (Chronic)   Other Visit Diagnoses       Need for hepatitis C screening test       Relevant Orders   Hepatitis C Antibody     Need for influenza vaccination       Relevant Orders   Flu vaccine trivalent PF, 6mos and older(Flulaval,Afluria,Fluarix,Fluzone) (Completed)     Anemia, unspecified type       Relevant Orders    CBC with Differential/Platelet   Iron, TIBC and Ferritin Panel     Encounter to establish care         Abnormal TSH       Relevant  Orders   TSH     Screening for HIV without presence of risk factors       Relevant Orders   HIV Antibody (routine testing w rflx)     Screening for diabetes mellitus       Relevant Orders   COMPLETE METABOLIC PANEL WITH GFR   Hemoglobin A1c     Screening for cholesterol level       Relevant Orders   Lipid panel        Assessment and Plan    Epilepsy Last seizure a few months ago, but seizures are reportedly less severe on current medication regimen. Continues to follow with pediatric neurology. -Continue Tegretol  200mg  daily.  Anemia Noted on recent ER visit with hemoglobin of 8.1 and hematocrit of 28. Patient has history of heavy periods. Iron supplementation is inconsistent. -Check CBC and iron studies today. -Discuss consistent iron supplementation pending lab results.  abnormal tsh Recent elevated TSH noted on ER visit. Patient was previously on Topamax , which can increase TSH, but is no longer taking this medication. No symptoms of hypothyroidism reported. -Check TSH today.  Migraines Infrequent, managed with ibuprofen  as needed. -Continue ibuprofen  as needed for migraines.  Palpitations Recent episode noted. Possible link to anemia. -Management plan pending results of CBC.  Allergic Sinusitis Managed with daily Zyrtec  10mg  and Flonase . -Continue Zyrtec  10mg  daily and Flonase  as needed.        Follow up plan: Return in about 6 months (around 12/23/2023) for cpe.

## 2023-06-26 LAB — CBC WITH DIFFERENTIAL/PLATELET
Absolute Lymphocytes: 2356 {cells}/uL (ref 850–3900)
Absolute Monocytes: 370 {cells}/uL (ref 200–950)
Basophils Absolute: 30 {cells}/uL (ref 0–200)
Basophils Relative: 0.7 %
Eosinophils Absolute: 39 {cells}/uL (ref 15–500)
Eosinophils Relative: 0.9 %
HCT: 29.7 % — ABNORMAL LOW (ref 35.0–45.0)
Hemoglobin: 8.5 g/dL — ABNORMAL LOW (ref 11.7–15.5)
MCH: 20 pg — ABNORMAL LOW (ref 27.0–33.0)
MCHC: 28.6 g/dL — ABNORMAL LOW (ref 32.0–36.0)
MCV: 70 fL — ABNORMAL LOW (ref 80.0–100.0)
MPV: 11.7 fL (ref 7.5–12.5)
Monocytes Relative: 8.6 %
Neutro Abs: 1505 {cells}/uL (ref 1500–7800)
Neutrophils Relative %: 35 %
Platelets: 532 10*3/uL — ABNORMAL HIGH (ref 140–400)
RBC: 4.24 10*6/uL (ref 3.80–5.10)
RDW: 18.6 % — ABNORMAL HIGH (ref 11.0–15.0)
Total Lymphocyte: 54.8 %
WBC: 4.3 10*3/uL (ref 3.8–10.8)

## 2023-06-26 LAB — COMPLETE METABOLIC PANEL WITH GFR
AG Ratio: 1.7 (calc) (ref 1.0–2.5)
ALT: 19 U/L (ref 6–29)
AST: 25 U/L (ref 10–30)
Albumin: 4.8 g/dL (ref 3.6–5.1)
Alkaline phosphatase (APISO): 128 U/L — ABNORMAL HIGH (ref 31–125)
BUN: 11 mg/dL (ref 7–25)
CO2: 24 mmol/L (ref 20–32)
Calcium: 9.9 mg/dL (ref 8.6–10.2)
Chloride: 106 mmol/L (ref 98–110)
Creat: 0.54 mg/dL (ref 0.50–0.96)
Globulin: 2.8 g/dL (ref 1.9–3.7)
Glucose, Bld: 68 mg/dL (ref 65–99)
Potassium: 4.7 mmol/L (ref 3.5–5.3)
Sodium: 137 mmol/L (ref 135–146)
Total Bilirubin: 0.3 mg/dL (ref 0.2–1.2)
Total Protein: 7.6 g/dL (ref 6.1–8.1)
eGFR: 135 mL/min/{1.73_m2} (ref >=60)

## 2023-06-26 LAB — HEPATITIS C ANTIBODY: Hepatitis C Ab: NONREACTIVE

## 2023-06-26 LAB — CBC MORPHOLOGY

## 2023-06-26 LAB — LIPID PANEL
Cholesterol: 143 mg/dL (ref <200)
HDL: 56 mg/dL (ref >=50)
LDL Cholesterol (Calc): 79 mg/dL
Non-HDL Cholesterol (Calc): 87 mg/dL (ref <130)
Total CHOL/HDL Ratio: 2.6 (calc) (ref <5.0)
Triglycerides: 29 mg/dL (ref <150)

## 2023-06-26 LAB — HEMOGLOBIN A1C
Hgb A1c MFr Bld: 5.8 %{Hb} — ABNORMAL HIGH (ref <5.7)
Mean Plasma Glucose: 120 mg/dL
eAG (mmol/L): 6.6 mmol/L

## 2023-06-26 LAB — IRON,TIBC AND FERRITIN PANEL
%SAT: 3 % — ABNORMAL LOW (ref 16–45)
Ferritin: 2 ng/mL — ABNORMAL LOW (ref 16–154)
Iron: 15 ug/dL — ABNORMAL LOW (ref 40–190)
TIBC: 480 ug/dL — ABNORMAL HIGH (ref 250–450)

## 2023-06-26 LAB — HIV ANTIBODY (ROUTINE TESTING W REFLEX): HIV 1&2 Ab, 4th Generation: NONREACTIVE

## 2023-06-26 LAB — TSH: TSH: 1.05 m[IU]/L

## 2023-06-28 ENCOUNTER — Other Ambulatory Visit: Payer: Self-pay | Admitting: Nurse Practitioner

## 2023-06-28 DIAGNOSIS — D500 Iron deficiency anemia secondary to blood loss (chronic): Secondary | ICD-10-CM

## 2023-06-28 DIAGNOSIS — N920 Excessive and frequent menstruation with regular cycle: Secondary | ICD-10-CM

## 2023-06-29 ENCOUNTER — Ambulatory Visit (INDEPENDENT_AMBULATORY_CARE_PROVIDER_SITE_OTHER): Payer: Medicaid Other | Admitting: Licensed Practical Nurse

## 2023-06-29 VITALS — BP 115/66 | HR 97 | Wt 108.3 lb

## 2023-06-29 DIAGNOSIS — N920 Excessive and frequent menstruation with regular cycle: Secondary | ICD-10-CM | POA: Diagnosis not present

## 2023-06-29 MED ORDER — NORETHINDRONE 0.35 MG PO TABS: 1.0000 | ORAL_TABLET | Freq: Every day | ORAL | 11 refills | Status: AC

## 2023-06-30 DIAGNOSIS — N920 Excessive and frequent menstruation with regular cycle: Secondary | ICD-10-CM | POA: Insufficient documentation

## 2023-06-30 NOTE — Progress Notes (Signed)
 HPI:      [name] is a 23 y.o. G0P0000 who LMP was Patient's last menstrual period was 06/19/2023 (exact date)., presents today for a problem visit.  She complains of heavy menstrual periods.  Menarche 15  Cycles are monthly, last 7 days, are heavy- for the first 3 days needs to change pad every 30 mins. Sometimes passes small clots, She has cramping all 7 days, Ibuprofen  600mg  helps. She does have miss school or work because of the bleeding, but not every cycle. She has been told she is anemic. Labs done on 1/3 by PCP abnormal. She has never been sexually active. She does not use tobacco, denies hx of CHTN, does have hx Migraines (pt reports she does have aura but chart notes Migraine without aura). Denies PCOS symptoms. Reports her periods have always been like this. Her mother reports the women in their family have heavy periods, some have required hysterectomies.   PMHx: She  has a past medical history of Headache(784.0) and Seizures (HCC). Also,  has no past surgical history on file., family history includes Diabetes in her maternal grandmother; Heart Problems in her paternal grandmother; Hypertension in her maternal grandmother; Lung cancer in her maternal grandfather; Migraines in an other family member; Seizures in her brother and another family member.,  reports that she has never smoked. She has never been exposed to tobacco smoke. She has never used smokeless tobacco. She reports that she does not drink alcohol and does not use drugs.  She  Current Outpatient Medications:    cetirizine  (ZYRTEC ) 10 MG tablet, Take 1 tablet (10 mg total) by mouth daily., Disp: 30 tablet, Rfl: 1   fluticasone  (FLONASE ) 50 MCG/ACT nasal spray, Place 1 spray into both nostrils daily. 1 spray by Nasal route once daily as needed., Disp: 16 g, Rfl: 1   ibuprofen  (ADVIL ,MOTRIN ) 200 MG tablet, Take 200 mg by mouth every 6 (six) hours as needed., Disp: , Rfl:    norethindrone  (ORTHO MICRONOR ) 0.35 MG  tablet, Take 1 tablet (0.35 mg total) by mouth daily., Disp: 28 tablet, Rfl: 11   TEGRETOL  200 MG tablet, Take 1+1/2 in the morning, 1 at midday and 1+1/2 at bedtime, Disp: 120 tablet, Rfl: 1  Also, is allergic to milk-related compounds.  ROSsee hPI   Objective: BP 115/66 (BP Location: Right Arm, Patient Position: Sitting, Cuff Size: Normal)   Pulse 97   Wt 108 lb 4.8 oz (49.1 kg)   LMP 06/19/2023 (Exact Date)   BMI 19.18 kg/m  Physical Exam Constitutional:      Appearance: Normal appearance.  Cardiovascular:     Rate and Rhythm: Normal rate.  Pulmonary:     Effort: Pulmonary effort is normal.  Neurological:     Mental Status: She is alert.  Psychiatric:        Mood and Affect: Mood normal.   PE not necessary for purpose of visit   ASSESSMENT/PLAN:  heavy vaginal bleeding   Problem List Items Addressed This Visit   None Visit Diagnoses       Menorrhagia with regular cycle    -  Primary   Relevant Medications   norethindrone  (ORTHO MICRONOR ) 0.35 MG tablet        -reviewed I am not certain why you are experiencing heavy periods. You deny symptoms of PCOS, I have low suspicion of PCOS. This could be endometriosis-the only way to diagnose this is through surgical procedure-we do not need to do this at this time. We  can first try treatment. Discussed pelvic exam today, at this time a pelvic exam may not be helpful. You are due for a pap at age 37, we can defer an exam until then. A pelvic US  may be helpful, but we can also try treatment and then consider US . Verina would like treatment. Her mother asked is her anemia really that bad that she needs treatment. Reviewed most recent labs were low and to improve Tanasia's quality of life-so that she does not miss school/work and have to manage chanign products every - recommend treatment with hormones. Will try POP's. Risks/benefits and side effects of POP reviewed.   RTC in 3 months for follow up   Jinnie Cookey, EDDY  [facility]  06/30/23  12:47 PM

## 2023-07-02 ENCOUNTER — Encounter: Payer: Self-pay | Admitting: *Deleted

## 2023-07-05 ENCOUNTER — Inpatient Hospital Stay: Payer: Medicaid Other | Admitting: Internal Medicine

## 2023-07-05 ENCOUNTER — Inpatient Hospital Stay: Payer: Medicaid Other

## 2023-07-12 ENCOUNTER — Inpatient Hospital Stay: Payer: Medicaid Other | Admitting: Internal Medicine

## 2023-07-12 ENCOUNTER — Inpatient Hospital Stay: Payer: Medicaid Other

## 2023-07-16 ENCOUNTER — Inpatient Hospital Stay: Payer: Medicaid Other | Attending: Internal Medicine | Admitting: Internal Medicine

## 2023-07-16 ENCOUNTER — Inpatient Hospital Stay: Payer: Medicaid Other

## 2023-07-21 ENCOUNTER — Encounter: Payer: Self-pay | Admitting: Nurse Practitioner

## 2023-08-10 ENCOUNTER — Ambulatory Visit: Payer: Medicaid Other | Attending: Internal Medicine | Admitting: Internal Medicine

## 2023-11-18 ENCOUNTER — Encounter (HOSPITAL_COMMUNITY): Payer: Self-pay

## 2023-11-18 ENCOUNTER — Other Ambulatory Visit: Payer: Self-pay

## 2023-11-18 ENCOUNTER — Emergency Department (HOSPITAL_COMMUNITY)
Admission: EM | Admit: 2023-11-18 | Discharge: 2023-11-19 | Discharge status: Against Medical Advice | Attending: Emergency Medicine | Admitting: Emergency Medicine

## 2023-11-18 DIAGNOSIS — R519 Headache, unspecified: Secondary | ICD-10-CM | POA: Insufficient documentation

## 2023-11-18 DIAGNOSIS — Z5321 Procedure and treatment not carried out due to patient leaving prior to being seen by health care provider: Secondary | ICD-10-CM | POA: Diagnosis not present

## 2023-11-18 NOTE — ED Triage Notes (Signed)
 Pt arrived POV from home c/o a headache to the back of her head x1 week. Pt states the pain has been constant and it feels like a knot on the back of her head. Pt endorses some nausea.

## 2023-11-23 ENCOUNTER — Other Ambulatory Visit: Payer: Self-pay

## 2023-11-23 ENCOUNTER — Emergency Department (HOSPITAL_COMMUNITY)
Admission: EM | Admit: 2023-11-23 | Discharge: 2023-11-23 | Disposition: A | Discharge status: Home / Self Care | Attending: Emergency Medicine | Admitting: Emergency Medicine

## 2023-11-23 ENCOUNTER — Encounter (HOSPITAL_COMMUNITY): Payer: Self-pay

## 2023-11-23 DIAGNOSIS — N10 Acute tubulo-interstitial nephritis: Secondary | ICD-10-CM | POA: Diagnosis not present

## 2023-11-23 DIAGNOSIS — R1030 Lower abdominal pain, unspecified: Secondary | ICD-10-CM | POA: Diagnosis present

## 2023-11-23 LAB — COMPREHENSIVE METABOLIC PANEL WITH GFR
ALT: 16 U/L (ref 0–44)
AST: 24 U/L (ref 15–41)
Albumin: 4.1 g/dL (ref 3.5–5.0)
Alkaline Phosphatase: 99 U/L (ref 38–126)
Anion gap: 9 (ref 5–15)
BUN: 13 mg/dL (ref 6–20)
CO2: 23 mmol/L (ref 22–32)
Calcium: 9.1 mg/dL (ref 8.9–10.3)
Chloride: 103 mmol/L (ref 98–111)
Creatinine, Ser: 0.63 mg/dL (ref 0.44–1.00)
GFR, Estimated: >60 mL/min (ref >60)
Glucose, Bld: 159 mg/dL — ABNORMAL HIGH (ref 70–99)
Potassium: 3.6 mmol/L (ref 3.5–5.1)
Sodium: 135 mmol/L (ref 135–145)
Total Bilirubin: 0.6 mg/dL (ref 0.0–1.2)
Total Protein: 7.5 g/dL (ref 6.5–8.1)

## 2023-11-23 LAB — CBC
HCT: 30.4 % — ABNORMAL LOW (ref 36.0–46.0)
Hemoglobin: 9 g/dL — ABNORMAL LOW (ref 12.0–15.0)
MCH: 22.4 pg — ABNORMAL LOW (ref 26.0–34.0)
MCHC: 29.6 g/dL — ABNORMAL LOW (ref 30.0–36.0)
MCV: 75.6 fL — ABNORMAL LOW (ref 80.0–100.0)
Platelets: 389 10*3/uL (ref 150–400)
RBC: 4.02 MIL/uL (ref 3.87–5.11)
RDW: 21.2 % — ABNORMAL HIGH (ref 11.5–15.5)
WBC: 7.1 10*3/uL (ref 4.0–10.5)
nRBC: 0 % (ref 0.0–0.2)

## 2023-11-23 LAB — URINALYSIS, ROUTINE W REFLEX MICROSCOPIC
Bacteria, UA: NONE SEEN
Bilirubin Urine: NEGATIVE
Glucose, UA: NEGATIVE mg/dL
Ketones, ur: NEGATIVE mg/dL
Nitrite: NEGATIVE
Protein, ur: 30 mg/dL — AB
Specific Gravity, Urine: 1.018 (ref 1.005–1.030)
WBC, UA: >50 WBC/hpf (ref 0–5)
pH: 7 (ref 5.0–8.0)

## 2023-11-23 LAB — HCG, SERUM, QUALITATIVE: Preg, Serum: NEGATIVE

## 2023-11-23 LAB — LIPASE, BLOOD: Lipase: 38 U/L (ref 11–51)

## 2023-11-23 MED ORDER — CIPROFLOXACIN HCL 500 MG PO TABS
500.0000 mg | ORAL_TABLET | Freq: Once | ORAL | Status: AC
  Administered 2023-11-23: 500 mg via ORAL
  Filled 2023-11-23: qty 1

## 2023-11-23 MED ORDER — CIPROFLOXACIN HCL 500 MG PO TABS
500.0000 mg | ORAL_TABLET | Freq: Two times a day (BID) | ORAL | 0 refills | Status: AC
Start: 2023-11-23 — End: ?

## 2023-11-23 NOTE — ED Triage Notes (Signed)
 Pt arrives via POV. Pt reports right lower quadrant pain, nausea, and diarrhea since last night. Reports symptoms have been intermittent for the past 2 months. Pt AxOx4.

## 2023-11-23 NOTE — ED Provider Notes (Signed)
 [place] EMERGENCY DEPARTMENT AT [facility] Provider Note   CSN: [id number] Arrival date & time: 11/23/23  0932     History  Chief Complaint  Patient presents with   Abdominal Pain   Nausea   Diarrhea    Madison [name] is a 23 y.o. female who presents with a 1 week history of lower abdominal pain along with nausea.  She states that the symptoms have been intermittent for the last 2 months though she has had worsening lower abdominal pain over the last 2 months.  Denies having any dysuria though states she has had increasing nausea in the last week as well.  Denies any vaginal bleeding, denies any vaginal discharge.  Notable for previous history of epilepsy, iron deficiency anemia   Abdominal Pain Associated symptoms: diarrhea and nausea   Diarrhea Associated symptoms: abdominal pain        Home Medications Prior to Admission medications   Medication Sig Start Date End Date Taking? Authorizing Provider  cetirizine  (ZYRTEC ) 10 MG tablet Take 1 tablet (10 mg total) by mouth daily. 03/08/17   Tonita Frater, NP  fluticasone  (FLONASE ) 50 MCG/ACT nasal spray Place 1 spray into both nostrils daily. 1 spray by Nasal route once daily as needed. 03/08/17   Tonita Frater, NP  ibuprofen  (ADVIL ,MOTRIN ) 200 MG tablet Take 200 mg by mouth every 6 (six) hours as needed.    [provider]  norethindrone  (ORTHO MICRONOR ) 0.35 MG tablet Take 1 tablet (0.35 mg total) by mouth daily. 06/29/23   Dominic, Alva Jewels, CNM  TEGRETOL  200 MG tablet Take 1+1/2 in the morning, 1 at midday and 1+1/2 at bedtime 10/21/22   Lyndol Santee, NP      Allergies    Milk-related compounds    Review of Systems   Review of Systems  Gastrointestinal:  Positive for abdominal pain, diarrhea and nausea.  All other systems reviewed and are negative.   Physical Exam Updated Vital Signs BP 107/68 (BP Location: Left Arm)   Pulse (!) 113   Temp 98.1 F (36.7 C)  (Oral)   Resp 18   Ht 5\' 3"  (1.6 m)   Wt 48.1 kg   LMP 11/23/2023   BMI 18.78 kg/m  Physical Exam Vitals and nursing note reviewed.  Constitutional:      General: She is not in acute distress.    Appearance: Normal appearance.  HENT:     Head: Normocephalic and atraumatic.     Mouth/Throat:     Mouth: Mucous membranes are moist.     Pharynx: Oropharynx is clear.  Eyes:     Extraocular Movements: Extraocular movements intact.     Conjunctiva/sclera: Conjunctivae normal.     Pupils: Pupils are equal, round, and reactive to light.  Cardiovascular:     Rate and Rhythm: Normal rate and regular rhythm.     Pulses: Normal pulses.     Heart sounds: Normal heart sounds. No murmur heard.    No friction rub. No gallop.  Pulmonary:     Effort: Pulmonary effort is normal.     Breath sounds: Normal breath sounds.  Abdominal:     General: Abdomen is flat. Bowel sounds are normal.     Palpations: Abdomen is soft.     Tenderness: There is abdominal tenderness in the right upper quadrant. There is right CVA tenderness. There is no left CVA tenderness.  Musculoskeletal:        General: Normal range of motion.  Cervical back: Normal range of motion and neck supple.     Right lower leg: No edema.     Left lower leg: No edema.  Skin:    General: Skin is warm and dry.     Capillary Refill: Capillary refill takes less than 2 seconds.  Neurological:     General: No focal deficit present.     Mental Status: She is alert. Mental status is at baseline.  Psychiatric:        Mood and Affect: Mood normal.     ED Results / Procedures / Treatments   Labs (all labs ordered are listed, but only abnormal results are displayed) Labs Reviewed  COMPREHENSIVE METABOLIC PANEL WITH GFR - Abnormal; Notable for the following components:      Result Value   Glucose, Bld 159 (*)    All other components within normal limits  CBC - Abnormal; Notable for the following components:   Hemoglobin 9.0 (*)     HCT 30.4 (*)    MCV 75.6 (*)    MCH 22.4 (*)    MCHC 29.6 (*)    RDW 21.2 (*)    All other components within normal limits  URINALYSIS, ROUTINE W REFLEX MICROSCOPIC - Abnormal; Notable for the following components:   APPearance HAZY (*)    Hgb urine dipstick MODERATE (*)    Protein, ur 30 (*)    Leukocytes,Ua MODERATE (*)    All other components within normal limits  LIPASE, BLOOD  HCG, SERUM, QUALITATIVE    EKG None  Radiology No results found.  Procedures Procedures    Medications Ordered in ED Medications  ciprofloxacin (CIPRO) tablet 500 mg (has no administration in time range)    ED Course/ Medical Decision Making/ A&P                                 Medical Decision Making Amount and/or Complexity of Data Reviewed Labs: ordered.  Risk Prescription drug management.   Medical Decision Making:   Madison [name] is a 23 y.o. female who presented to the ED today with lower abdominal pain and flank pain associated with nausea.  Detailed above.     Complete initial physical exam performed, notably the patient  was alert and oriented in no apparent distress, there is point pain noted to the right lower quadrant as well as noted CVA tenderness on the right side.  Cardiac and pulmonary auscultation are unremarkable, bowel sounds are present and normal.     Reviewed and confirmed nursing documentation for past medical history, family history, social history.    Initial Assessment:   With the patient's presentation of lower abdominal pain flank pain, most likely diagnosis is cystitis/pyelonephritis. Other diagnoses were considered including (but not limited to) renal stone, other abdominal pathology. These are considered less likely due to history of present illness and physical exam findings.     Initial Plan:    Screening labs including CBC and Metabolic panel to evaluate for infectious or metabolic etiology of disease.  Urinalysis with reflex culture ordered to  evaluate for UTI or relevant urologic/nephrologic pathology.  Serum lipase to address pancreatic pathology Obtain serum hCG to assess for pregnancy Objective evaluation as below reviewed   Initial Study Results:   Laboratory  All laboratory results reviewed without evidence of clinically relevant pathology.   Exceptions include: UA does show hazy urine with moderate hemoglobin, proteinuria, presence of leukocyte esterase,  all consistent with infection  CBC shows hemoglobin of 9 with an MCV of 75.6.  This is consistent with her previous diagnosis of iron deficiency anemia.  She is supposed to be taking iron for the same however states she does not.   Reassessment and Plan:   Based on clinical exam findings of positive CVA tenderness along with tenderness to the right lower quadrant, along with urine findings indicative of infection, believe this patient's symptoms are secondary to acute cystitis with pyelonephritis.  As she is stable, will manage with outpatient course of ciprofloxacin.  Regarding iron deficiency anemia, referred to primary care for definitive management.  May need referral for iron infusion.  This was discussed with the patient, she is in agreement and states she will follow-up with her primary care.        Final Clinical Impression(s) / ED Diagnoses Final diagnoses:  Acute pyelonephritis    Rx / DC Orders ED Discharge Orders     None         Juanetta Nordmann, PA 11/23/23 1220    Lind Repine, MD 11/25/23 [phone]

## 2023-12-29 ENCOUNTER — Encounter: Payer: Self-pay | Admitting: Nurse Practitioner

## 2023-12-29 NOTE — Progress Notes (Deleted)
 Name: Madison [name]   MRN: [id number]    DOB: 27-May-2001   Date:12/29/2023       Progress Note  Subjective  Chief Complaint  No chief complaint on file.   HPI  Patient presents for annual CPE.  Diet: *** Exercise: ***  Sleep: *** Last dental exam:*** Last eye exam: ***   Depression: Phq 9 is  {Desc; negative/positive:13464}    06/25/2023   11:08 AM  Depression screen PHQ 2/9  Decreased Interest 0  Down, Depressed, Hopeless 0  PHQ - 2 Score 0  Altered sleeping 0  Tired, decreased energy 0  Change in appetite 0  Feeling bad or failure about yourself  0  Trouble concentrating 0  Moving slowly or fidgety/restless 0  Suicidal thoughts 0  PHQ-9 Score 0  Difficult doing work/chores Not difficult at all   Hypertension: BP Readings from Last 3 Encounters:  11/23/23 109/64  11/19/23 (!) 111/57  06/29/23 115/66   Obesity: Wt Readings from Last 3 Encounters:  11/23/23 106 lb (48.1 kg)  11/18/23 109 lb (49.4 kg)  06/29/23 108 lb 4.8 oz (49.1 kg)   BMI Readings from Last 3 Encounters:  11/23/23 18.78 kg/m  11/18/23 18.71 kg/m  06/29/23 19.18 kg/m     Vaccines:  HPV: up to at age 56 , ask insurance if age between 53-45  Shingrix: 6-64 yo and ask insurance if covered when patient above 29 yo Pneumonia: *** educated and discussed with patient. Flu: *** educated and discussed with patient.  Hep C Screening: 06/25/2023 STD testing and prevention (HIV/chl/gon/syphilis): 06/25/2023 Intimate partner violence:*** Sexual History : Menstrual History/LMP/Abnormal Bleeding:  Incontinence Symptoms:   Breast cancer:  - Last Mammogram: Does not qualify.  - BRCA gene screening: None   Osteoporosis: Discussed high calcium and vitamin D supplementation, weight bearing exercises  Cervical cancer screening: Does not qualify   Skin cancer: Discussed monitoring for atypical lesions  Colorectal cancer: Does not qualify    Lung cancer:  Does not qualify  Low Dose CT Chest  recommended if Age 65-80 years, 20 pack-year currently smoking OR have quit w/in 15years. Patient does not qualify.   ECG: 05/17/2023  Advanced Care Planning: A voluntary discussion about advance care planning including the explanation and discussion of advance directives.  Discussed health care proxy and Living will, and the patient was able to identify a health care proxy as ***.  Patient {DOES_DOES WNU:81435} have a living will at present time. If patient does have living will, I have requested they bring this to the clinic to be scanned in to their chart.  Lipids: Lab Results  Component Value Date   CHOL 143 06/25/2023   Lab Results  Component Value Date   HDL 56 06/25/2023   Lab Results  Component Value Date   LDLCALC 79 06/25/2023   Lab Results  Component Value Date   TRIG 29 06/25/2023   Lab Results  Component Value Date   CHOLHDL 2.6 06/25/2023   No results found for: LDLDIRECT  Glucose: Glucose, Bld  Date Value Ref Range Status  11/23/2023 159 (H) 70 - 99 mg/dL Final    Comment:    Glucose reference range applies only to samples taken after fasting for at least 8 hours.  06/25/2023 68 65 - 99 mg/dL Final    Comment:    .            Fasting reference interval .   05/15/2023 102 (H) 70 - 99 mg/dL Final  Comment:    Glucose reference range applies only to samples taken after fasting for at least 8 hours.   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  Family History  Problem Relation Age of Onset   Seizures Brother    Diabetes Maternal Grandmother    Hypertension Maternal Grandmother    Heart Problems Paternal Grandmother    Seizures Other    Migraines Other    Lung cancer Maternal Grandfather        Died at 54    Social History   Socioeconomic History   Marital status: Single    Spouse name: Not on file   Number of children: Not on file   Years of education: Not on file   Highest education level: Not on file  Occupational History   Not on file  Tobacco Use   Smoking status: Never    Passive exposure: Never   Smokeless tobacco: Never  Vaping Use   Vaping status: Never Used  Substance and Sexual Activity   Alcohol use: No    Alcohol/week: 0.0 standard drinks of alcohol   Drug use: No   Sexual activity: Never  Other Topics Concern   Not on file  Social History Narrative   Madison [name] attended [employer].   Currently Attending [employer].    She lives with her mother and brother.    She enjoys dance, shopping, and playing.   Social Drivers of Corporate investment banker Strain: Not on file  Food Insecurity: Not on file  Transportation Needs: Not on file  Physical Activity: Not on file  Stress: Not on file  Social Connections: Not on file  Intimate Partner Violence: Not on file     Current Outpatient Medications:    cetirizine  (ZYRTEC ) 10 MG tablet, Take 1 tablet (10 mg total) by mouth daily., Disp: 30 tablet, Rfl: 1   ciprofloxacin  (CIPRO ) 500 MG tablet, Take 1 tablet (500 mg total) by mouth every 12 (twelve) hours., Disp: 10 tablet, Rfl: 0   fluticasone  (FLONASE ) 50 MCG/ACT nasal spray, Place 1 spray into both nostrils daily. 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  There is no height or weight on file to calculate BMI.  Physical Exam ***  Recent Results (from the past 2160 hours)  Urinalysis, Routine w reflex microscopic -Urine, Clean Catch     Status: Abnormal   Collection Time: 11/23/23  9:50 AM  Result Value Ref Range   Color, Urine YELLOW YELLOW   APPearance HAZY (A) CLEAR   Specific Gravity, Urine 1.018 1.005 - 1.030   pH 7.0 5.0 - 8.0   Glucose, UA NEGATIVE NEGATIVE mg/dL   Hgb urine dipstick MODERATE (A) NEGATIVE   Bilirubin Urine NEGATIVE NEGATIVE   Ketones, ur NEGATIVE NEGATIVE mg/dL   Protein, ur 30 (A) NEGATIVE mg/dL   Nitrite NEGATIVE NEGATIVE   Leukocytes,Ua MODERATE (A) NEGATIVE   RBC / HPF 21-50 0 - 5 RBC/hpf   WBC, UA >50 0 - 5 WBC/hpf   Bacteria, UA NONE SEEN NONE SEEN   Squamous Epithelial / HPF 0-5 0 - 5 /HPF   WBC Clumps PRESENT    Mucus PRESENT    Amorphous Crystal PRESENT     Comment: Performed at [facility], [address], [place], [place] [address]  Lipase, blood     Status: None   Collection Time: 11/23/23 10:36 AM  Result Value Ref Range   Lipase 38 11 - 51 U/L    Comment: Performed at [facility], [address] [place], [place] [address]  Comprehensive metabolic panel     Status: Abnormal   Collection Time: 11/23/23 10:36 AM  Result Value Ref Range   Sodium 135 135 - 145 mmol/L   Potassium 3.6 3.5 - 5.1 mmol/L   Chloride 103 98 - 111 mmol/L   CO2 23 22 - 32 mmol/L   Glucose, Bld 159 (H) 70 - 99 mg/dL    Comment: Glucose reference range applies only to samples taken after fasting for at  least 8 hours.   BUN 13 6 - 20 mg/dL   Creatinine, Ser 9.36 0.44 - 1.00 mg/dL   Calcium 9.1 8.9 - 89.6 mg/dL   Total Protein 7.5 6.5 - 8.1 g/dL   Albumin 4.1 3.5 - 5.0 g/dL   AST 24 15 - 41 U/L   ALT 16 0 - 44 U/L   Alkaline Phosphatase 99 38 - 126 U/L   Total Bilirubin 0.6 0.0 - 1.2 mg/dL   GFR, Estimated >39 >39 mL/min    Comment: (NOTE) Calculated using the CKD-EPI Creatinine Equation (2021)    Anion gap 9 5 - 15    Comment: Performed at [facility], [address], [place], [place] [address]  CBC     Status: Abnormal   Collection Time: 11/23/23 10:36 AM  Result Value Ref Range   WBC 7.1 4.0 - 10.5 K/uL   RBC 4.02 3.87 - 5.11 MIL/uL   Hemoglobin 9.0 (L) 12.0 - 15.0 g/dL   HCT 69.5 (L) 63.9 - 53.9 %   MCV 75.6 (L) 80.0 - 100.0 fL   MCH 22.4 (L) 26.0 - 34.0 pg   MCHC 29.6 (L) 30.0 - 36.0 g/dL   RDW 78.7 (H) 88.4 - 84.4 %   Platelets 389 150 - 400 K/uL   nRBC 0.0 0.0 - 0.2 %    Comment: Performed at [facility], [address], [place], [place] [address]  hCG, serum, qualitative     Status: None   Collection Time: 11/23/23 10:36 AM  Result Value Ref Range   Preg, Serum NEGATIVE NEGATIVE    Comment:        THE SENSITIVITY OF THIS METHODOLOGY  IS >10 mIU/mL. Performed at [facility], [address], [place], [place] [address]     Diabetic Foot Exam: Diabetic Foot Exam - Simple   No data filed    ***  Fall Risk:    06/25/2023   11:07 AM  Fall Risk   Falls in the past year? 0  Number falls in past yr: 0  Injury with Fall? 0  Risk for fall due to : No Fall Risks  Follow up Falls prevention discussed;Education provided;Falls evaluation completed   ***  Functional Status Survey:   ***  Assessment & Plan  There are no diagnoses linked to this encounter.  -USPSTF grade A and B recommendations reviewed with patient; age-appropriate recommendations, preventive care, screening tests, etc discussed and  encouraged; healthy living encouraged; see AVS for patient education given to patient -Discussed importance of 150 minutes of physical activity weekly, eat two servings of fish weekly, eat one serving of tree nuts ( cashews, pistachios, pecans, almonds.SABRA) every other day, eat 6 servings of fruit/vegetables daily and drink plenty of water and avoid sweet beverages.   -Reviewed Health Maintenance: Yes

## 2024-01-06 ENCOUNTER — Encounter: Payer: Self-pay | Admitting: Nurse Practitioner

## 2024-01-06 ENCOUNTER — Ambulatory Visit (INDEPENDENT_AMBULATORY_CARE_PROVIDER_SITE_OTHER): Admitting: Nurse Practitioner

## 2024-01-06 VITALS — BP 114/62 | Temp 98.2°F | Resp 16 | Ht 64.5 in | Wt 105.3 lb

## 2024-01-06 DIAGNOSIS — Z131 Encounter for screening for diabetes mellitus: Secondary | ICD-10-CM

## 2024-01-06 DIAGNOSIS — Z13220 Encounter for screening for lipoid disorders: Secondary | ICD-10-CM | POA: Diagnosis not present

## 2024-01-06 DIAGNOSIS — Z0000 Encounter for general adult medical examination without abnormal findings: Secondary | ICD-10-CM

## 2024-01-06 DIAGNOSIS — D500 Iron deficiency anemia secondary to blood loss (chronic): Secondary | ICD-10-CM

## 2024-01-06 NOTE — Progress Notes (Signed)
 Name: Madison [name]   MRN: [id number]    DOB: [birth year]   Date:01/06/2024       Progress Note  Subjective  Chief Complaint: Annual physical exam    HPI  Patient presents for annual CPE.  Diet: Patient reports well balanced with all food groups Exercise: twice a week cardio and strength exercises   Sleep: 8-9 hours  Last dental exam:11/2022 Last eye exam: None   Hx of iron deficiency anemia will recheck level.   Flowsheet Row Office Visit from 01/06/2024 in [facility]  AUDIT-C Score 0   Depression: Phq 9 is  negative    01/06/2024   11:30 AM 06/25/2023   11:08 AM  Depression screen PHQ 2/9  Decreased Interest 0 0  Down, Depressed, Hopeless 0 0  PHQ - 2 Score 0 0  Altered sleeping 0 0  Tired, decreased energy 0 0  Change in appetite 0 0  Feeling bad or failure about yourself  0 0  Trouble concentrating 0 0  Moving slowly or fidgety/restless 0 0  Suicidal thoughts 0 0  PHQ-9 Score 0 0  Difficult doing work/chores Not difficult at all Not difficult at all   Hypertension: BP Readings from Last 3 Encounters:  01/06/24 114/62  11/23/23 109/64  11/19/23 (!) 111/57   Obesity: Wt Readings from Last 3 Encounters:  01/06/24 105 lb 4.8 oz (47.8 kg)  11/23/23 106 lb (48.1 kg)  11/18/23 109 lb (49.4 kg)   BMI Readings from Last 3 Encounters:  01/06/24 17.80 kg/m  11/23/23 18.78 kg/m  11/18/23 18.71 kg/m     Vaccines:  HPV: up to at age 38 , ask insurance if age between 26-45  Shingrix: 1-64 yo and ask insurance if covered when patient above 19 yo Pneumonia:  educated and discussed with patient. Flu:  educated and discussed with patient.  Hep C Screening: 06/25/2023 STD testing and prevention (HIV/chl/gon/syphilis): 06/25/2023 Intimate partner violence:None  Sexual History : Yes, with one partner not on birth control and not interested  Menstrual History/LMP/Abnormal Bleeding: lmp: 12/05/2023 Incontinence Symptoms: none  Breast cancer:   - Last Mammogram: does not qualify  - BRCA gene screening: none   Osteoporosis: Discussed high calcium and vitamin D supplementation, weight bearing exercises  Cervical cancer screening: does not qualify  Skin cancer: Discussed monitoring for atypical lesions  Colorectal cancer: does not qualify    Lung cancer:   Low Dose CT Chest recommended if Age 32-80 years, 20 pack-year currently smoking OR have quit w/in 15years. Patient does not qualify.   ECG: 05/17/2023  Advanced Care Planning: A voluntary discussion about advance care planning including the explanation and discussion of advance directives.  Discussed health care proxy and Living will, and the patient was able to identify a health care proxy as Mom .  Patient does not have a living will at present time. If patient does have living will, I have requested they bring this to the clinic to be scanned in to their chart.  Lipids: Lab Results  Component Value Date   CHOL 143 06/25/2023   Lab Results  Component Value Date   HDL 56 06/25/2023   Lab Results  Component Value Date   LDLCALC 79 06/25/2023   Lab Results  Component Value Date   TRIG 29 06/25/2023   Lab Results  Component Value Date   CHOLHDL 2.6 06/25/2023   No results found for: LDLDIRECT  Glucose: Glucose, Bld  Date Value Ref Range Status  11/23/2023  159 (H) 70 - 99 mg/dL Final    Comment:    Glucose reference range applies only to samples taken after fasting for at least 8 hours.  06/25/2023 68 65 - 99 mg/dL Final    Comment:    .            Fasting reference interval .   05/15/2023 102 (H) 70 - 99 mg/dL Final    Comment:    Glucose reference range applies only to samples taken after fasting for at least 8 hours.   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 No pertinent surgical history.  Family History  Problem Relation Age of Onset   Seizures Brother    Diabetes Maternal Grandmother    Hypertension Maternal Grandmother    Heart Problems Paternal Grandmother    Seizures Other    Migraines Other    Lung cancer Maternal Grandfather        Died at 29    Social History   Socioeconomic History   Marital status: Single    Spouse name: Not on file   Number of children: Not on file   Years of education: Not on file   Highest education level: Not on file  Occupational History   Not on file  Tobacco Use   Smoking status: Never    Passive exposure: Never   Smokeless tobacco: Never  Vaping Use   Vaping status: Never Used  Substance and Sexual Activity   Alcohol use: No    Alcohol/week: 0.0 standard drinks of alcohol   Drug use: No   Sexual activity: Never  Other Topics Concern   Not on file  Social History Narrative   Brendy attended [employer].   Currently Attending [employer].    She lives with her mother and brother.    She enjoys dance, shopping, and playing.   Social Drivers of Corporate investment banker Strain: Low Risk  (01/06/2024)   Overall Financial Resource Strain (CARDIA)    Difficulty of Paying Living Expenses: Not hard at all  Food Insecurity: No Food Insecurity (01/06/2024)   Hunger Vital Sign    Worried About Running Out of Food in the Last Year: Never true    Ran Out of Food in the Last Year: Never true  Transportation Needs: No Transportation Needs (01/06/2024)   PRAPARE - Scientist, research (physical sciences) (Medical): No    Lack of Transportation (Non-Medical): No  Physical Activity: Inactive (01/06/2024)   Exercise Vital Sign    Days of Exercise per Week: 0 days    Minutes of Exercise per Session: 0 min  Stress: No Stress Concern Present (01/06/2024)   [employer] of Occupational Health - Occupational Stress Questionnaire    Feeling of Stress: Not at all  Social Connections: Not on file  Intimate Partner Violence: Not At Risk (01/06/2024)   Humiliation, Afraid, Rape,  and Kick questionnaire    Fear of Current or Ex-Partner: No    Emotionally Abused: No    Physically Abused: No    Sexually Abused: No     Current Outpatient Medications:    cetirizine  (ZYRTEC ) 10 MG tablet, Take 1 tablet (10 mg total) by mouth daily., Disp: 30 tablet, Rfl: 1   fluticasone  (FLONASE ) 50 MCG/ACT nasal spray, Place 1 spray into both nostrils daily. 1 spray by Nasal route once daily as needed., Disp: 16 g, Rfl: 1   ibuprofen  (ADVIL ,MOTRIN ) 200 MG tablet, Take 200 mg by mouth every 6 (six) hours as needed., Disp: , Rfl:    TEGRETOL  200 MG tablet, Take 1+1/2 in the morning, 1 at midday and 1+1/2 at bedtime, Disp: 120 tablet, Rfl: 1   ciprofloxacin  (CIPRO ) 500 MG tablet, Take 1 tablet (500 mg total) by mouth every 12 (twelve) hours., Disp: 10 tablet, Rfl: 0   norethindrone  (ORTHO MICRONOR ) 0.35 MG tablet, Take 1 tablet (0.35 mg total) by mouth daily. 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  Respiratory: Negative for cough and shortness of breath.   Cardiovascular: Negative for chest pain or palpitations.  Gastrointestinal: Negative for abdominal pain, no bowel changes.  Musculoskeletal: Negative for gait problem or joint swelling.  Skin: Negative for rash.  Neurological: Negative for dizziness or headache.   No other specific complaints in a complete review of systems (except as listed in HPI above).   Objective  Vitals:   01/06/24 1122  BP: 114/62  Resp: 16  Temp: 98.2 F (36.8 C)  SpO2: 98%  Weight: 105 lb 4.8 oz (47.8 kg)  Height: 5' 4.5 (1.638 m)    Body mass index is 17.8 kg/m.  Physical Exam Constitutional:      Appearance: Normal appearance.  HENT:     Head: Normocephalic and atraumatic.     Nose: Nose normal.     Mouth/Throat:     Mouth: Mucous membranes are moist.  Eyes:     Pupils: Pupils are equal, round, and reactive to light.  Cardiovascular:     Rate and Rhythm: Normal rate and regular rhythm.     Pulses: Normal pulses.     Heart sounds: Normal heart sounds.  Pulmonary:     Effort: Pulmonary effort is normal.     Breath sounds: Normal breath sounds.  Abdominal:     General: Abdomen is flat. Bowel sounds are normal.     Palpations: Abdomen is soft.  Musculoskeletal:     Cervical back: Normal range of motion.  Skin:    General: Skin is warm and dry.  Neurological:     General: No focal deficit present.     Mental Status: She is alert and oriented to person, place, and time.  Psychiatric:        Mood and Affect: Mood normal.        Behavior: Behavior normal.        Thought Content: Thought content normal.        Judgment: Judgment normal.      Recent Results (from the past 2160 hours)  Urinalysis, Routine w reflex microscopic -Urine, Clean Catch     Status: Abnormal   Collection Time: 11/23/23  9:50 AM  Result Value Ref Range   Color, Urine YELLOW  YELLOW   APPearance HAZY (A) CLEAR   Specific Gravity, Urine 1.018 1.005 - 1.030   pH 7.0 5.0 - 8.0   Glucose, UA NEGATIVE NEGATIVE mg/dL   Hgb urine dipstick MODERATE (A) NEGATIVE   Bilirubin Urine NEGATIVE NEGATIVE   Ketones, ur NEGATIVE NEGATIVE mg/dL   Protein, ur 30 (A) NEGATIVE mg/dL   Nitrite NEGATIVE NEGATIVE   Leukocytes,Ua MODERATE (A) NEGATIVE   RBC / HPF 21-50 0 - 5 RBC/hpf   WBC,  UA >50 0 - 5 WBC/hpf   Bacteria, UA NONE SEEN NONE SEEN   Squamous Epithelial / HPF 0-5 0 - 5 /HPF   WBC Clumps PRESENT    Mucus PRESENT    Amorphous Crystal PRESENT     Comment: Performed at [facility], [address] [place], [place] [address]  Lipase, blood     Status: None   Collection Time: 11/23/23 10:36 AM  Result Value Ref Range   Lipase 38 11 - 51 U/L    Comment: Performed at [facility], [address] [place], [place] [address]  Comprehensive metabolic panel     Status: Abnormal   Collection Time: 11/23/23 10:36 AM  Result Value Ref Range   Sodium 135 135 - 145 mmol/L   Potassium 3.6 3.5 - 5.1 mmol/L   Chloride 103 98 - 111 mmol/L   CO2 23 22 - 32 mmol/L   Glucose, Bld 159 (H) 70 - 99 mg/dL    Comment: Glucose reference range applies only to samples taken after fasting for at least 8 hours.   BUN 13 6 - 20 mg/dL   Creatinine, Ser 9.36 0.44 - 1.00 mg/dL   Calcium 9.1 8.9 - 89.6 mg/dL   Total Protein 7.5 6.5 - 8.1 g/dL   Albumin 4.1 3.5 - 5.0 g/dL   AST 24 15 - 41 U/L   ALT 16 0 - 44 U/L   Alkaline Phosphatase 99 38 - 126 U/L   Total Bilirubin 0.6 0.0 - 1.2 mg/dL   GFR, Estimated >39 >39 mL/min    Comment: (NOTE) Calculated using the CKD-EPI Creatinine Equation (2021)    Anion gap 9 5 - 15    Comment: Performed at [facility], [address] [place], [place] [address]  CBC     Status: Abnormal   Collection Time: 11/23/23 10:36 AM  Result Value Ref Range   WBC 7.1 4.0 - 10.5 K/uL   RBC 4.02 3.87 - 5.11 MIL/uL   Hemoglobin 9.0 (L) 12.0 - 15.0 g/dL   HCT 69.5 (L) 63.9 - 53.9 %   MCV 75.6 (L) 80.0 - 100.0 fL   MCH 22.4 (L) 26.0 - 34.0 pg   MCHC 29.6 (L) 30.0 - 36.0 g/dL   RDW 78.7 (H) 88.4 - 84.4 %   Platelets 389 150 - 400 K/uL   nRBC 0.0 0.0 - 0.2 %    Comment: Performed at [facility], [address] [place], [place] [address]  hCG, serum, qualitative     Status: None    Collection Time: 11/23/23 10:36 AM  Result Value Ref Range   Preg, Serum NEGATIVE NEGATIVE    Comment:        THE SENSITIVITY OF THIS METHODOLOGY IS >10 mIU/mL. Performed at [facility], [address] [place], [place] [address]        Fall Risk:    01/06/2024   11:30 AM 06/25/2023   11:07 AM  Fall Risk   Falls in the past year? 0 0  Number falls in past yr: 0 0  Injury with Fall? 0 0  Risk for fall due to :  No Fall Risks  Follow up Falls evaluation completed Falls prevention discussed;Education provided;Falls evaluation completed     Functional Status Survey: Is the patient deaf or have difficulty hearing?: No Does the patient have difficulty seeing, even when wearing glasses/contacts?: No Does the patient have difficulty concentrating, remembering, or making decisions?: No Does the patient have difficulty walking or climbing stairs?: No Does the patient have difficulty dressing or bathing?: No Does the patient have difficulty doing errands alone such as visiting a doctor's office or shopping?: No   Assessment & Plan 1. Annual physical exam (Primary)  - CBC with Differential/Platelet - Hemoglobin A1c - Lipid panel - Comprehensive metabolic panel with GFR  2. Screening for cholesterol level  - Lipid panel  3. Screening for diabetes mellitus  - Hemoglobin A1c  4. Iron deficiency anemia due to chronic blood loss  - CBC with Differential/Platelet - Iron, TIBC and Ferritin Panel   -USPSTF grade A and B recommendations reviewed with patient; age-appropriate recommendations, preventive care, screening tests, etc discussed and encouraged; healthy living encouraged; see AVS for patient education given to patient -Discussed importance of 150 minutes of physical activity weekly, eat two servings of fish weekly, eat one serving of tree nuts ( cashews, pistachios, pecans, almonds.SABRA) every other day, eat 6 servings of fruit/vegetables daily and drink  plenty of water and avoid sweet beverages.   -Reviewed Health Maintenance: YES  I have reviewed this encounter including the documentation in this note and/or discussed this patient with the provider, Aislinn Womack, SNP, I am certifying that I agree with the content of this note as supervising/preceptor nurse practitioner.  Mliss Spray, FNP-C [facility] 01/06/2024, 11:54 AM

## 2024-01-07 ENCOUNTER — Ambulatory Visit: Payer: Self-pay | Admitting: Nurse Practitioner

## 2024-01-07 LAB — CBC WITH DIFFERENTIAL/PLATELET
Absolute Lymphocytes: 2064 {cells}/uL (ref 850–3900)
Absolute Monocytes: 312 {cells}/uL (ref 200–950)
Basophils Absolute: 22 {cells}/uL (ref 0–200)
Basophils Relative: 0.5 %
Eosinophils Absolute: 0 {cells}/uL — ABNORMAL LOW (ref 15–500)
Eosinophils Relative: 0 %
HCT: 33.4 % — ABNORMAL LOW (ref 35.0–45.0)
Hemoglobin: 9.6 g/dL — ABNORMAL LOW (ref 11.7–15.5)
MCH: 21.8 pg — ABNORMAL LOW (ref 27.0–33.0)
MCHC: 28.7 g/dL — ABNORMAL LOW (ref 32.0–36.0)
MCV: 75.7 fL — ABNORMAL LOW (ref 80.0–100.0)
MPV: 11.4 fL (ref 7.5–12.5)
Monocytes Relative: 7.1 %
Neutro Abs: 2002 {cells}/uL (ref 1500–7800)
Neutrophils Relative %: 45.5 %
Platelets: 371 Thousand/uL (ref 140–400)
RBC: 4.41 Million/uL (ref 3.80–5.10)
RDW: 18.1 % — ABNORMAL HIGH (ref 11.0–15.0)
Total Lymphocyte: 46.9 %
WBC: 4.4 Thousand/uL (ref 3.8–10.8)

## 2024-01-07 LAB — IRON,TIBC AND FERRITIN PANEL
%SAT: 5 % — ABNORMAL LOW (ref 16–45)
Ferritin: 3 ng/mL — ABNORMAL LOW (ref 16–154)
Iron: 26 ug/dL — ABNORMAL LOW (ref 40–190)
TIBC: 495 ug/dL — ABNORMAL HIGH (ref 250–450)

## 2024-01-07 LAB — COMPREHENSIVE METABOLIC PANEL WITH GFR
AG Ratio: 1.7 (calc) (ref 1.0–2.5)
ALT: 11 U/L (ref 6–29)
AST: 19 U/L (ref 10–30)
Albumin: 4.8 g/dL (ref 3.6–5.1)
Alkaline phosphatase (APISO): 97 U/L (ref 31–125)
BUN: 10 mg/dL (ref 7–25)
CO2: 22 mmol/L (ref 20–32)
Calcium: 9.7 mg/dL (ref 8.6–10.2)
Chloride: 105 mmol/L (ref 98–110)
Creat: 0.57 mg/dL (ref 0.50–0.96)
Globulin: 2.8 g/dL (ref 1.9–3.7)
Glucose, Bld: 79 mg/dL (ref 65–99)
Potassium: 4.2 mmol/L (ref 3.5–5.3)
Sodium: 136 mmol/L (ref 135–146)
Total Bilirubin: 0.4 mg/dL (ref 0.2–1.2)
Total Protein: 7.6 g/dL (ref 6.1–8.1)
eGFR: 133 mL/min/1.73m2 (ref >=60)

## 2024-01-07 LAB — LIPID PANEL
Cholesterol: 150 mg/dL (ref <200)
HDL: 58 mg/dL (ref >=50)
LDL Cholesterol (Calc): 83 mg/dL
Non-HDL Cholesterol (Calc): 92 mg/dL (ref <130)
Total CHOL/HDL Ratio: 2.6 (calc) (ref <5.0)
Triglycerides: 33 mg/dL (ref <150)

## 2024-01-07 LAB — HEMOGLOBIN A1C
Hgb A1c MFr Bld: 5.8 % — ABNORMAL HIGH (ref <5.7)
Mean Plasma Glucose: 120 mg/dL
eAG (mmol/L): 6.6 mmol/L

## 2024-04-14 ENCOUNTER — Encounter (HOSPITAL_COMMUNITY): Payer: Self-pay | Admitting: *Deleted

## 2024-04-14 ENCOUNTER — Emergency Department (HOSPITAL_COMMUNITY): Admission: EM | Admit: 2024-04-14 | Discharge: 2024-04-14 | Disposition: A | Discharge status: Home / Self Care

## 2024-04-14 ENCOUNTER — Other Ambulatory Visit: Payer: Self-pay

## 2024-04-14 DIAGNOSIS — Z202 Contact with and (suspected) exposure to infections with a predominantly sexual mode of transmission: Secondary | ICD-10-CM | POA: Insufficient documentation

## 2024-04-14 DIAGNOSIS — R1031 Right lower quadrant pain: Secondary | ICD-10-CM | POA: Diagnosis present

## 2024-04-14 DIAGNOSIS — Z711 Person with feared health complaint in whom no diagnosis is made: Secondary | ICD-10-CM

## 2024-04-14 LAB — COMPREHENSIVE METABOLIC PANEL WITH GFR
ALT: 17 U/L (ref 0–44)
AST: 31 U/L (ref 15–41)
Albumin: 4.6 g/dL (ref 3.5–5.0)
Alkaline Phosphatase: 100 U/L (ref 38–126)
Anion gap: 11 (ref 5–15)
BUN: 17 mg/dL (ref 6–20)
CO2: 20 mmol/L — ABNORMAL LOW (ref 22–32)
Calcium: 9.6 mg/dL (ref 8.9–10.3)
Chloride: 105 mmol/L (ref 98–111)
Creatinine, Ser: 0.64 mg/dL (ref 0.44–1.00)
GFR, Estimated: >60 mL/min (ref >60)
Glucose, Bld: 89 mg/dL (ref 70–99)
Potassium: 3.9 mmol/L (ref 3.5–5.1)
Sodium: 137 mmol/L (ref 135–145)
Total Bilirubin: <0.2 mg/dL (ref 0.0–1.2)
Total Protein: 7.9 g/dL (ref 6.5–8.1)

## 2024-04-14 LAB — CBC
HCT: 30.3 % — ABNORMAL LOW (ref 36.0–46.0)
Hemoglobin: 8.6 g/dL — ABNORMAL LOW (ref 12.0–15.0)
MCH: 21 pg — ABNORMAL LOW (ref 26.0–34.0)
MCHC: 28.4 g/dL — ABNORMAL LOW (ref 30.0–36.0)
MCV: 74.1 fL — ABNORMAL LOW (ref 80.0–100.0)
Platelets: 299 K/uL (ref 150–400)
RBC: 4.09 MIL/uL (ref 3.87–5.11)
RDW: 19.1 % — ABNORMAL HIGH (ref 11.5–15.5)
WBC: 5.7 K/uL (ref 4.0–10.5)
nRBC: 0 % (ref 0.0–0.2)

## 2024-04-14 LAB — URINALYSIS, ROUTINE W REFLEX MICROSCOPIC
Bilirubin Urine: NEGATIVE
Glucose, UA: NEGATIVE mg/dL
Hgb urine dipstick: NEGATIVE
Ketones, ur: NEGATIVE mg/dL
Leukocytes,Ua: NEGATIVE
Nitrite: NEGATIVE
Protein, ur: NEGATIVE mg/dL
Specific Gravity, Urine: 1.03 (ref 1.005–1.030)
pH: 6 (ref 5.0–8.0)

## 2024-04-14 LAB — HCG, SERUM, QUALITATIVE: Preg, Serum: NEGATIVE

## 2024-04-14 LAB — LIPASE, BLOOD: Lipase: 41 U/L (ref 11–51)

## 2024-04-14 LAB — WET PREP, GENITAL
Clue Cells Wet Prep HPF POC: NONE SEEN
Sperm: NONE SEEN
Trich, Wet Prep: NONE SEEN
WBC, Wet Prep HPF POC: <10 (ref <10)
Yeast Wet Prep HPF POC: NONE SEEN

## 2024-04-14 LAB — HIV ANTIBODY (ROUTINE TESTING W REFLEX): HIV Screen 4th Generation wRfx: NONREACTIVE

## 2024-04-14 LAB — PREGNANCY, URINE: Preg Test, Ur: NEGATIVE

## 2024-04-14 NOTE — Discharge Instructions (Addendum)
 Thank you for letting us  evaluate you today.  Your STD testing will result in next 24 hours.  Please follow-up on MyChart for results.  We will call you if you need to be treated for something.  We did not find etiology of your right lower abdominal pain as you did not wish to proceed with ultrasound or CT imaging.  Fortunately you have no fever nor elevated white count to indicate severe infection.  Return to Emergency Department you experience significant worsening of abdominal pain especially if you start vomiting secondary to pain, inability to have bowel movement greater than 5 days and not passing gas, worsening symptoms

## 2024-04-14 NOTE — ED Provider Notes (Signed)
 [place] EMERGENCY DEPARTMENT AT [facility] Provider Note   CSN: [id number] Arrival date & time: 04/14/24  1703     Patient presents with: SEXUALLY TRANSMITTED DISEASE and Possible Pregnancy   Nimco Bivens is a 23 y.o. female with past medical history of epilepsy, migraine, cardiac murmur presents Emergency Department for evaluation of STD testing, right sided lower abdominal pain.  RLQ abdominal pain has been intermittently occurring over the past 2 weeks.  Pain is described as sharp and stabbing.  Last BM was yesterday and normal.  Is passing gas.  Denies vomiting, diarrhea, vaginal symptoms, urinary symptoms.  LMP 03/24/2024 and normally has these monthly.  Denies fevers  Also wants to be tested for STDs, pregnancy.  She has no vaginal symptoms nor known exposure.    Possible Pregnancy       Prior to Admission medications   Medication Sig Start Date End Date Taking? Authorizing Provider  cetirizine  (ZYRTEC ) 10 MG tablet Take 1 tablet (10 mg total) by mouth daily. 03/08/17   Jakie Mariel Boon, NP  ciprofloxacin  (CIPRO ) 500 MG tablet Take 1 tablet (500 mg total) by mouth every 12 (twelve) hours. 11/23/23   Myriam Dorn BROCKS, PA  fluticasone  (FLONASE ) 50 MCG/ACT nasal spray Place 1 spray into both nostrils daily. 1 spray by Nasal route once daily as needed. 03/08/17   Jakie Mariel Boon, NP  ibuprofen  (ADVIL ,MOTRIN ) 200 MG tablet Take 200 mg by mouth every 6 (six) hours as needed.    [provider]  norethindrone  (ORTHO MICRONOR ) 0.35 MG tablet Take 1 tablet (0.35 mg total) by mouth daily. Patient not taking: Reported on 01/06/2024 06/29/23   Delinda Jinnie Jansky, CNM  TEGRETOL  200 MG tablet Take 1+1/2 in the morning, 1 at midday and 1+1/2 at bedtime 10/21/22   Marianna City, NP    Allergies: Milk-related compounds    Review of Systems  Genitourinary:  Negative for vaginal bleeding and vaginal discharge.    Updated Vital Signs BP  114/71 (BP Location: Left Arm)   Pulse 63   Temp 98.2 F (36.8 C) (Oral)   Resp 16   Ht 5' 3 (1.6 m)   Wt 48.5 kg   LMP 03/24/2024   SpO2 100%   BMI 18.95 kg/m   Physical Exam Vitals and nursing note reviewed.  Constitutional:      General: She is not in acute distress.    Appearance: Normal appearance.  HENT:     Head: Normocephalic and atraumatic.  Eyes:     Conjunctiva/sclera: Conjunctivae normal.  Cardiovascular:     Rate and Rhythm: Normal rate.  Pulmonary:     Effort: Pulmonary effort is normal. No respiratory distress.     Breath sounds: Normal breath sounds.  Abdominal:     General: Bowel sounds are normal. There is no distension.     Palpations: Abdomen is soft.     Tenderness: There is abdominal tenderness in the right lower quadrant. There is no guarding or rebound.     Comments: Nonsurgical abdomen with no peritoneal signs  Skin:    Coloration: Skin is not jaundiced or pale.  Neurological:     Mental Status: She is alert and oriented to person, place, and time. Mental status is at baseline.     (all labs ordered are listed, but only abnormal results are displayed) Labs Reviewed  COMPREHENSIVE METABOLIC PANEL WITH GFR - Abnormal; Notable for the following components:      Result Value   CO2 20 (*)  All other components within normal limits  CBC - Abnormal; Notable for the following components:   Hemoglobin 8.6 (*)    HCT 30.3 (*)    MCV 74.1 (*)    MCH 21.0 (*)    MCHC 28.4 (*)    RDW 19.1 (*)    All other components within normal limits  WET PREP, GENITAL  LIPASE, BLOOD  URINALYSIS, ROUTINE W REFLEX MICROSCOPIC  HCG, SERUM, QUALITATIVE  PREGNANCY, URINE  RPR  HIV ANTIBODY (ROUTINE TESTING W REFLEX)  GC/CHLAMYDIA PROBE AMP ([place]) NOT AT [facility]    EKG: None  Radiology: No results found.   Procedures   Medications Ordered in the ED - No data to display                                  Medical Decision Making Amount and/or  Complexity of Data Reviewed Labs: ordered.   Patient presents to the ED for concern of RLQ abdominal pain, anemia, this involves an extensive number of treatment options, and is a complaint that carries with it a high risk of complications and morbidity.  The differential diagnosis includes appendicitis, ruptured cyst, ectopic pregnancy, torsion, diverticulitis, obstruction, perforation, PID   Co morbidities that complicate the patient evaluation  Anemia   Additional history obtained:  Additional history obtained from Nursing   External records from outside source obtained and reviewed including triage RN note   Lab Tests:  I Ordered, and personally interpreted labs.  The pertinent results include:   Hcg 8.6 UA wo infection Hgc neg     Test Considered:  CT TVUS    Problem List / ED Course:  STD testing No current complaints of vaginal symptoms, discharge, known exposure Will obtain HIV, RPR, pregnancy test, GC chlamydia, wet prep Discussed that these results will not result prior to discharge and she can follow-up with MyChart or we will call her if she needs treatment.  She expressed understanding with this  RLQ abd pain Vital signs hemodynamically stable with no fever no tachycardia No leukocytosis Hemoglobin 8.6 which is similar to her baseline.  She does know that she has anemia and is supposed be taking iron supplementation however she is not consistent with that.  I discussed importance of taking iron supplementation daily Denies vomiting, diarrhea.  No fevers.  No known sick contacts.  Does not sound to be consistent with gastroenteritis, food poisoning I initially recommended CT abdomen and pelvis to rule out appendicitis, acute emergent pathology.  Patient refuses CT imaging.  I discussed the importance of obtaining CT imaging especially with the complaint of sharp stabbing RLQ abdominal pain.  I discussed that I cannot rule out any emergent etiology of  abdominal pain and she expresses understanding.  She reports I came here after work and I am tired.  She continues to refuse CT imaging following me explaining importance of obtaining it. I also discussed that if CT does not show any acute emergent finding that we may consider TVUS to ensure no intermittent torsion of ovary, cyst, ruptured cyst, etc.  Fortunately, she has no vomiting and does not appear to be in significant pain at this time.  Low suspicion for ovarian torsion currently.  However, she also adamantly refuses transvaginal ultrasound I also discussed importance of obtaining pelvic exam however patient wishes to self swab and does not want pelvic ultrasound.  STD results pending.  She adamantly refuses this  as well.  No fever no tachycardia and if symptoms were secondary to PID, patient likely would be tachycardic and febrile showing signs of sepsis at this time.  No leukocytosis She states I just want to get STD testing and leave.  She reports that she will follow-up with PCP Overall, patient appears well she is ambulatory in ED.  Walks to the bathroom without difficulty nor signs of physical distress or pain Passes p.o. challenge Remains hemodynamically stable throughout ED visit   Reevaluation:  After the interventions noted above, I reevaluated the patient and found that they have :stayed the same   Dispostion:  After consideration of the diagnostic results and the patients response to treatment, I feel that the patent would benefit from outpatient management PCP follow-up.  Unfortunately, I was unable to find etiology of RLQ abdominal pain.  I extensively discussed my recommendations of CT imaging, TVUS, pelvic exam however patient refuses all of these.  She expresses understanding that I am unable to rule in or out an emergent pathology of her right lower quadrant pain.  Fortunately, she appears well.  Her vital signs are hemodynamically stable with no tachycardia nor fever.   There is no leukocytosis.  She does not appear septic.  She is stable at discharge.  She will follow-up with PCP  Discussed ED workup, disposition, strict return to ED precautions with patient who expresses understanding agrees with plan.  All questions answered to their satisfaction.  They are agreeable to plan.  Discharge instructions and return precautions provided on paperwork  Final diagnoses:  Concern about STD in female without diagnosis  RLQ abdominal pain    ED Discharge Orders     None        Minnie Tinnie BRAVO, PA 04/15/24 1809    Kammerer, Megan L, DO 04/17/24 1006

## 2024-04-14 NOTE — ED Triage Notes (Signed)
 Pt comes in I want everything checked when ask to clarify, STD and preg test. Rt side abd pain and runny off both for approx 2 weeks.

## 2024-04-14 NOTE — ED Provider Triage Note (Addendum)
 Emergency Medicine Provider Triage Evaluation Note  Madison [name] , a 23 y.o. female  was evaluated in triage.  Pt complains of lower abd pain for past 2 weeks. No vomiting Sharp stabbing intermittent pain. No vaginal symptoms, urinary symptoms  Last BM yesterday and normal. LMP 03/24/24 and normally monthly  Also wants to be tested for all STDs, pregnancy. No vaginal symptoms nor known exposure  Review of Systems  Positive: See hpi Negative: fevers  Physical Exam  BP 114/71 (BP Location: Left Arm)   Pulse 63   Temp 98.2 F (36.8 C) (Oral)   Resp 16   Ht 5' 3 (1.6 m)   Wt 48.5 kg   LMP 03/24/2024   SpO2 100%   BMI 18.95 kg/m  Gen:   Awake, no distress   Resp:  Normal effort  MSK:   Moves extremities without difficulty  Other:  Rlq abd pain  Medical Decision Making  Medically screening exam initiated at 6:19 PM.  Appropriate orders placed.  Era Parr was informed that the remainder of the evaluation will be completed by another provider, this initial triage assessment does not replace that evaluation, and the importance of remaining in the ED until their evaluation is complete.  Labs ordered   Minnie Tinnie BRAVO, PA 04/14/24 1823    Minnie Tinnie BRAVO, PA 04/14/24 [phone]

## 2024-04-15 LAB — RPR: RPR Ser Ql: NONREACTIVE

## 2024-04-17 LAB — GC/CHLAMYDIA PROBE AMP (~~LOC~~) NOT AT ARMC
Chlamydia: NEGATIVE
Comment: NEGATIVE
Comment: NORMAL
Neisseria Gonorrhea: NEGATIVE

## 2024-05-16 ENCOUNTER — Other Ambulatory Visit: Payer: Self-pay

## 2024-05-16 ENCOUNTER — Emergency Department (HOSPITAL_COMMUNITY)
Admission: EM | Admit: 2024-05-16 | Discharge: 2024-05-16 | Disposition: A | Discharge status: Home / Self Care | Attending: Emergency Medicine | Admitting: Emergency Medicine

## 2024-05-16 DIAGNOSIS — R531 Weakness: Secondary | ICD-10-CM

## 2024-05-16 DIAGNOSIS — D649 Anemia, unspecified: Secondary | ICD-10-CM | POA: Insufficient documentation

## 2024-05-16 DIAGNOSIS — D509 Iron deficiency anemia, unspecified: Secondary | ICD-10-CM

## 2024-05-16 DIAGNOSIS — R42 Dizziness and giddiness: Secondary | ICD-10-CM | POA: Diagnosis present

## 2024-05-16 LAB — CBC WITH DIFFERENTIAL/PLATELET
Abs Immature Granulocytes: 0.01 K/uL (ref 0.00–0.07)
Basophils Absolute: 0 K/uL (ref 0.0–0.1)
Basophils Relative: 1 %
Eosinophils Absolute: 0 K/uL (ref 0.0–0.5)
Eosinophils Relative: 1 %
HCT: 31.7 % — ABNORMAL LOW (ref 36.0–46.0)
Hemoglobin: 9.2 g/dL — ABNORMAL LOW (ref 12.0–15.0)
Immature Granulocytes: 0 %
Lymphocytes Relative: 49 %
Lymphs Abs: 2.2 K/uL (ref 0.7–4.0)
MCH: 21.4 pg — ABNORMAL LOW (ref 26.0–34.0)
MCHC: 29 g/dL — ABNORMAL LOW (ref 30.0–36.0)
MCV: 73.7 fL — ABNORMAL LOW (ref 80.0–100.0)
Monocytes Absolute: 0.5 K/uL (ref 0.1–1.0)
Monocytes Relative: 10 %
Neutro Abs: 1.7 K/uL (ref 1.7–7.7)
Neutrophils Relative %: 39 %
Platelets: 381 K/uL (ref 150–400)
RBC: 4.3 MIL/uL (ref 3.87–5.11)
RDW: 19.6 % — ABNORMAL HIGH (ref 11.5–15.5)
WBC: 4.4 K/uL (ref 4.0–10.5)
nRBC: 0 % (ref 0.0–0.2)

## 2024-05-16 LAB — BASIC METABOLIC PANEL WITH GFR
Anion gap: 5 (ref 5–15)
BUN: 10 mg/dL (ref 6–20)
CO2: 27 mmol/L (ref 22–32)
Calcium: 9.3 mg/dL (ref 8.9–10.3)
Chloride: 107 mmol/L (ref 98–111)
Creatinine, Ser: 0.58 mg/dL (ref 0.44–1.00)
GFR, Estimated: >60 mL/min (ref >60)
Glucose, Bld: 82 mg/dL (ref 70–99)
Potassium: 3.9 mmol/L (ref 3.5–5.1)
Sodium: 138 mmol/L (ref 135–145)

## 2024-05-16 LAB — URINALYSIS, ROUTINE W REFLEX MICROSCOPIC
Bilirubin Urine: NEGATIVE
Glucose, UA: NEGATIVE mg/dL
Hgb urine dipstick: NEGATIVE
Ketones, ur: NEGATIVE mg/dL
Leukocytes,Ua: NEGATIVE
Nitrite: NEGATIVE
Protein, ur: NEGATIVE mg/dL
Specific Gravity, Urine: 1.027 (ref 1.005–1.030)
pH: 5 (ref 5.0–8.0)

## 2024-05-16 LAB — PREGNANCY, URINE: Preg Test, Ur: NEGATIVE

## 2024-05-16 NOTE — ED Triage Notes (Addendum)
 Pt ambulatory to triage reporting feeling weak, and a missed period. Pt states that she was due for her period on 11/22, but home preg test was negative. Pt reports a hx of low iron, and thinks that could be a cause. PT has a rx of iron supplements, but dopes not take them. When asked why, pt states I get busy.   PT appears to be in no distress during triage.

## 2024-05-16 NOTE — Discharge Instructions (Signed)
 As we discussed, it continue taking the iron supplementation that was provided to you but make sure to take this consistently.  Would highly recommend that you follow-up with your primary care as at this stage iron infusions may be indicated, however this would need to be in consultation with your primary care for continued monitoring of your hemoglobin.  Of course if you have any worsening signs or symptoms, present back your to the emergency department for further evaluation and management.

## 2024-05-16 NOTE — ED Provider Notes (Signed)
 [place] EMERGENCY DEPARTMENT AT [facility] Provider Note   CSN: [id number] Arrival date & time: 05/16/24  1020     Patient presents with: Weakness and Late Period   Madison [name] is a 23 y.o. female is ED today with primary concern of generalized weakness and fatigue, also endorses intermittent dizziness.  She further is concerned that her period is approximately 1 week past due, has taken a at home pregnancy test with negative result.  Is not currently on any oral contraceptives, and is concerned she may be pregnant despite the negative test at home.  Previously diagnosed with anemia, and currently taking iron supplementation however she endorses that she is inconsistent with this.      Weakness Associated symptoms: dizziness        Prior to Admission medications   Medication Sig Start Date End Date Taking? Authorizing Provider  cetirizine  (ZYRTEC ) 10 MG tablet Take 1 tablet (10 mg total) by mouth daily. 03/08/17   Jakie Mariel Boon, NP  ciprofloxacin  (CIPRO ) 500 MG tablet Take 1 tablet (500 mg total) by mouth every 12 (twelve) hours. 11/23/23   Myriam Dorn BROCKS, PA  fluticasone  (FLONASE ) 50 MCG/ACT nasal spray Place 1 spray into both nostrils daily. 1 spray by Nasal route once daily as needed. 03/08/17   Jakie Mariel Boon, NP  ibuprofen  (ADVIL ,MOTRIN ) 200 MG tablet Take 200 mg by mouth every 6 (six) hours as needed.    [provider]  norethindrone  (ORTHO MICRONOR ) 0.35 MG tablet Take 1 tablet (0.35 mg total) by mouth daily. Patient not taking: Reported on 01/06/2024 06/29/23   Delinda Jinnie Jansky, CNM  TEGRETOL  200 MG tablet Take 1+1/2 in the morning, 1 at midday and 1+1/2 at bedtime 10/21/22   Marianna City, NP    Allergies: Milk-related compounds    Review of Systems  Neurological:  Positive for dizziness and weakness.  All other systems reviewed and are negative.   Updated Vital Signs BP 114/70 (BP Location: Left Arm)    Pulse 89   Temp 98.7 F (37.1 C) (Oral)   Resp 16   SpO2 100%   Physical Exam Vitals and nursing note reviewed.  Constitutional:      General: She is not in acute distress.    Appearance: Normal appearance.  HENT:     Head: Normocephalic and atraumatic.     Mouth/Throat:     Mouth: Mucous membranes are moist.     Pharynx: Oropharynx is clear.  Eyes:     Extraocular Movements: Extraocular movements intact.     Conjunctiva/sclera: Conjunctivae normal.     Pupils: Pupils are equal, round, and reactive to light.  Cardiovascular:     Rate and Rhythm: Normal rate and regular rhythm.     Pulses: Normal pulses.     Heart sounds: Normal heart sounds, S1 normal and S2 normal. No murmur heard.    No friction rub. No gallop.  Pulmonary:     Effort: Pulmonary effort is normal.     Breath sounds: Normal breath sounds and air entry.  Abdominal:     General: Abdomen is flat. Bowel sounds are normal.     Palpations: Abdomen is soft.     Tenderness: There is no abdominal tenderness.  Musculoskeletal:        General: Normal range of motion.     Cervical back: Normal range of motion and neck supple.     Right lower leg: No edema.     Left lower leg: No  edema.  Skin:    General: Skin is warm and dry.     Capillary Refill: Capillary refill takes less than 2 seconds.  Neurological:     General: No focal deficit present.     Mental Status: She is alert and oriented to person, place, and time. Mental status is at baseline.     GCS: GCS eye subscore is 4. GCS verbal subscore is 5. GCS motor subscore is 6.     Comments: No sensory deficits or motor function deficits are appreciated.  Psychiatric:        Mood and Affect: Mood normal.     (all labs ordered are listed, but only abnormal results are displayed) Labs Reviewed  CBC WITH DIFFERENTIAL/PLATELET - Abnormal; Notable for the following components:      Result Value   Hemoglobin 9.2 (*)    HCT 31.7 (*)    MCV 73.7 (*)    MCH 21.4 (*)     MCHC 29.0 (*)    RDW 19.6 (*)    All other components within normal limits  URINALYSIS, ROUTINE W REFLEX MICROSCOPIC - Abnormal; Notable for the following components:   APPearance HAZY (*)    All other components within normal limits  BASIC METABOLIC PANEL WITH GFR  PREGNANCY, URINE    EKG: None  Radiology: No results found.   Procedures   Medications Ordered in the ED - No data to display                                  Medical Decision Making Amount and/or Complexity of Data Reviewed Labs: ordered.   After physical evaluation of this patient along with review of her presenting signs and symptoms, differential diagnosis at this patient does include symptomatic anemia, as well as possible viral illness or other etiology of dizziness and weakness.  Given her history of iron deficiency anemia suspect that this is likely a result of worsening symptomatic anemia.  Workup does show a hemoglobin of 9.2 with MCV of 73.7, along with her previous history of iron deficiency anemia I find this is supportive of the diagnosis of microcytic anemia most likely iron deficiency anemia.  Inconsistent taking her iron supplementation, encouraged her to continue taking this with consistency.  Further evaluated patient to rule out possible pregnancy which urine pregnancy was negative, evaluated for UTI, urinalysis is negative.  BMP is unremarkable.  Given reassuring findings on the physical exam as well as the lab workup, findings consistent with a likely iron deficiency anemia and will refer to primary care for continued monitoring of her hemoglobin and likely referral for iron infusions.  Was thoroughly discussed with the patient in which she verbalizes understanding and agreement with the care plan has no further concerns at this time.  Given that she was stable during her time here in the emergency department and did not have any concerning findings on the physical exam nor on the workup we will  discharge and have her follow-up outpatient with her primary care as previously discussed.     Final diagnoses:  Microcytic anemia  Generalized weakness    ED Discharge Orders     None          Myriam Dorn BROCKS, PA 05/16/24 1517    Ruthe Cornet, DO 05/16/24 1524

## 2025-01-09 ENCOUNTER — Encounter: Admitting: Nurse Practitioner
